# Patient Record
Sex: Female | Born: 1940 | Race: Black or African American | Hispanic: No | Smoking: Never smoker
Health system: Southern US, Community
[De-identification: ages and names within clinical notes are randomized; demographics above are authoritative.]

## PROBLEM LIST (undated history)

## (undated) DIAGNOSIS — I10 Essential (primary) hypertension: Secondary | ICD-10-CM

## (undated) DIAGNOSIS — E785 Hyperlipidemia, unspecified: Secondary | ICD-10-CM

## (undated) DIAGNOSIS — M1990 Unspecified osteoarthritis, unspecified site: Secondary | ICD-10-CM

## (undated) DIAGNOSIS — N200 Calculus of kidney: Secondary | ICD-10-CM

## (undated) DIAGNOSIS — N135 Crossing vessel and stricture of ureter without hydronephrosis: Secondary | ICD-10-CM

## (undated) DIAGNOSIS — G459 Transient cerebral ischemic attack, unspecified: Secondary | ICD-10-CM

## (undated) HISTORY — PX: ABDOMINAL HYSTERECTOMY: SHX81

## (undated) HISTORY — PX: CHOLECYSTECTOMY: SHX55

---

## 1998-02-15 ENCOUNTER — Emergency Department (HOSPITAL_COMMUNITY): Admission: EM | Admit: 1998-02-15 | Discharge: 1998-02-15 | Payer: Self-pay | Admitting: Emergency Medicine

## 1998-02-15 ENCOUNTER — Encounter: Payer: Self-pay | Admitting: Emergency Medicine

## 1998-09-03 ENCOUNTER — Emergency Department (HOSPITAL_COMMUNITY): Admission: EM | Admit: 1998-09-03 | Discharge: 1998-09-03 | Payer: Self-pay | Admitting: Emergency Medicine

## 1998-10-16 ENCOUNTER — Encounter: Payer: Self-pay | Admitting: Family Medicine

## 1998-10-16 ENCOUNTER — Ambulatory Visit (HOSPITAL_COMMUNITY): Admission: RE | Admit: 1998-10-16 | Discharge: 1998-10-16 | Payer: Self-pay | Admitting: Family Medicine

## 2004-05-02 ENCOUNTER — Other Ambulatory Visit: Payer: Self-pay

## 2004-05-02 ENCOUNTER — Emergency Department: Payer: Self-pay | Admitting: Emergency Medicine

## 2004-05-10 ENCOUNTER — Ambulatory Visit: Payer: Self-pay | Admitting: Family Medicine

## 2004-05-11 ENCOUNTER — Ambulatory Visit: Payer: Self-pay | Admitting: *Deleted

## 2004-05-24 ENCOUNTER — Ambulatory Visit: Payer: Self-pay | Admitting: Family Medicine

## 2004-05-27 ENCOUNTER — Ambulatory Visit: Payer: Self-pay | Admitting: Family Medicine

## 2004-06-07 ENCOUNTER — Ambulatory Visit (HOSPITAL_COMMUNITY): Admission: RE | Admit: 2004-06-07 | Discharge: 2004-06-07 | Payer: Self-pay | Admitting: Family Medicine

## 2004-06-29 ENCOUNTER — Encounter: Admission: RE | Admit: 2004-06-29 | Discharge: 2004-06-29 | Payer: Self-pay | Admitting: Family Medicine

## 2004-08-09 ENCOUNTER — Ambulatory Visit: Payer: Self-pay | Admitting: Family Medicine

## 2004-08-09 LAB — CONVERTED CEMR LAB: Pap Smear: NORMAL

## 2004-10-11 ENCOUNTER — Ambulatory Visit: Payer: Self-pay | Admitting: Family Medicine

## 2005-03-15 ENCOUNTER — Ambulatory Visit: Payer: Self-pay | Admitting: Family Medicine

## 2005-04-05 ENCOUNTER — Ambulatory Visit: Payer: Self-pay | Admitting: Family Medicine

## 2005-06-13 ENCOUNTER — Ambulatory Visit: Payer: Self-pay | Admitting: Family Medicine

## 2005-06-21 ENCOUNTER — Ambulatory Visit: Payer: Self-pay | Admitting: Family Medicine

## 2005-07-01 ENCOUNTER — Ambulatory Visit: Payer: Self-pay | Admitting: Family Medicine

## 2005-08-15 ENCOUNTER — Ambulatory Visit: Payer: Self-pay | Admitting: Family Medicine

## 2005-08-15 LAB — CONVERTED CEMR LAB: Hgb A1c MFr Bld: 7.5 %

## 2005-08-17 ENCOUNTER — Ambulatory Visit: Payer: Self-pay | Admitting: Family Medicine

## 2005-09-05 ENCOUNTER — Emergency Department (HOSPITAL_COMMUNITY): Admission: EM | Admit: 2005-09-05 | Discharge: 2005-09-05 | Payer: Self-pay | Admitting: Emergency Medicine

## 2005-09-12 ENCOUNTER — Ambulatory Visit: Payer: Self-pay | Admitting: Family Medicine

## 2006-01-13 ENCOUNTER — Emergency Department (HOSPITAL_COMMUNITY): Admission: EM | Admit: 2006-01-13 | Discharge: 2006-01-13 | Payer: Self-pay | Admitting: Family Medicine

## 2006-04-08 ENCOUNTER — Emergency Department (HOSPITAL_COMMUNITY): Admission: EM | Admit: 2006-04-08 | Discharge: 2006-04-08 | Payer: Self-pay | Admitting: Family Medicine

## 2006-10-05 ENCOUNTER — Encounter (INDEPENDENT_AMBULATORY_CARE_PROVIDER_SITE_OTHER): Payer: Self-pay | Admitting: Family Medicine

## 2006-10-05 DIAGNOSIS — K219 Gastro-esophageal reflux disease without esophagitis: Secondary | ICD-10-CM | POA: Insufficient documentation

## 2006-10-05 DIAGNOSIS — E119 Type 2 diabetes mellitus without complications: Secondary | ICD-10-CM | POA: Insufficient documentation

## 2006-10-05 DIAGNOSIS — I10 Essential (primary) hypertension: Secondary | ICD-10-CM | POA: Insufficient documentation

## 2006-10-05 DIAGNOSIS — F329 Major depressive disorder, single episode, unspecified: Secondary | ICD-10-CM

## 2006-10-05 DIAGNOSIS — E785 Hyperlipidemia, unspecified: Secondary | ICD-10-CM

## 2006-10-05 LAB — CONVERTED CEMR LAB
Cholesterol: 146 mg/dL
LDL Cholesterol: 94 mg/dL
Triglycerides: 60 mg/dL
VLDL: 12 mg/dL

## 2007-07-30 ENCOUNTER — Inpatient Hospital Stay (HOSPITAL_COMMUNITY): Admission: EM | Admit: 2007-07-30 | Discharge: 2007-08-03 | Payer: Self-pay | Admitting: Emergency Medicine

## 2007-07-31 ENCOUNTER — Encounter (INDEPENDENT_AMBULATORY_CARE_PROVIDER_SITE_OTHER): Payer: Self-pay | Admitting: Internal Medicine

## 2007-07-31 ENCOUNTER — Ambulatory Visit: Payer: Self-pay | Admitting: Vascular Surgery

## 2007-08-15 ENCOUNTER — Encounter (INDEPENDENT_AMBULATORY_CARE_PROVIDER_SITE_OTHER): Payer: Self-pay | Admitting: Family Medicine

## 2007-09-06 ENCOUNTER — Encounter (INDEPENDENT_AMBULATORY_CARE_PROVIDER_SITE_OTHER): Payer: Self-pay | Admitting: Family Medicine

## 2007-10-01 ENCOUNTER — Other Ambulatory Visit: Admission: RE | Admit: 2007-10-01 | Discharge: 2007-10-01 | Payer: Self-pay | Admitting: Internal Medicine

## 2007-11-12 ENCOUNTER — Encounter: Admission: RE | Admit: 2007-11-12 | Discharge: 2007-11-12 | Payer: Self-pay | Admitting: Internal Medicine

## 2008-03-12 ENCOUNTER — Encounter: Admission: RE | Admit: 2008-03-12 | Discharge: 2008-03-12 | Payer: Self-pay | Admitting: Internal Medicine

## 2008-04-21 ENCOUNTER — Encounter (INDEPENDENT_AMBULATORY_CARE_PROVIDER_SITE_OTHER): Payer: Self-pay | Admitting: Family Medicine

## 2008-06-26 ENCOUNTER — Inpatient Hospital Stay (HOSPITAL_COMMUNITY): Admission: EM | Admit: 2008-06-26 | Discharge: 2008-06-27 | Payer: Self-pay | Admitting: Emergency Medicine

## 2008-06-26 ENCOUNTER — Ambulatory Visit: Payer: Self-pay | Admitting: Family Medicine

## 2008-06-26 ENCOUNTER — Encounter: Payer: Self-pay | Admitting: Family Medicine

## 2008-06-26 DIAGNOSIS — M109 Gout, unspecified: Secondary | ICD-10-CM

## 2008-06-26 DIAGNOSIS — R1031 Right lower quadrant pain: Secondary | ICD-10-CM

## 2008-10-22 ENCOUNTER — Emergency Department (HOSPITAL_COMMUNITY): Admission: EM | Admit: 2008-10-22 | Discharge: 2008-10-22 | Payer: Self-pay | Admitting: Family Medicine

## 2009-03-14 ENCOUNTER — Emergency Department (HOSPITAL_COMMUNITY): Admission: EM | Admit: 2009-03-14 | Discharge: 2009-03-14 | Payer: Self-pay | Admitting: Emergency Medicine

## 2009-04-19 ENCOUNTER — Emergency Department: Payer: Self-pay | Admitting: Unknown Physician Specialty

## 2009-06-08 ENCOUNTER — Emergency Department (HOSPITAL_COMMUNITY): Admission: EM | Admit: 2009-06-08 | Discharge: 2009-06-08 | Payer: Self-pay | Admitting: Family Medicine

## 2009-08-26 ENCOUNTER — Encounter: Admission: RE | Admit: 2009-08-26 | Discharge: 2009-08-26 | Payer: Self-pay | Admitting: Internal Medicine

## 2010-01-27 ENCOUNTER — Encounter (INDEPENDENT_AMBULATORY_CARE_PROVIDER_SITE_OTHER): Payer: Self-pay | Admitting: *Deleted

## 2010-03-21 ENCOUNTER — Encounter: Payer: Self-pay | Admitting: Internal Medicine

## 2010-04-01 NOTE — Miscellaneous (Signed)
Summary: medical record request  Clinical Lists Changes  Rec'd medical record request to go to [facility]: 10/23/09 Melanie Ceresi  January 27, 2010 11:53 AM

## 2010-05-16 LAB — URINE MICROSCOPIC-ADD ON

## 2010-05-16 LAB — COMPREHENSIVE METABOLIC PANEL
AST: 23 U/L (ref 0–37)
Albumin: 3.4 g/dL — ABNORMAL LOW (ref 3.5–5.2)
Alkaline Phosphatase: 88 U/L (ref 39–117)
Calcium: 9 mg/dL (ref 8.4–10.5)
Chloride: 101 mEq/L (ref 96–112)
GFR calc non Af Amer: 48 mL/min — ABNORMAL LOW (ref >60)

## 2010-05-16 LAB — POCT CARDIAC MARKERS
CKMB, poc: <1 ng/mL — ABNORMAL LOW (ref 1.0–8.0)
Myoglobin, poc: 53 ng/mL (ref 12–200)
Troponin i, poc: <0.05 ng/mL (ref 0.00–0.09)

## 2010-05-16 LAB — URINALYSIS, ROUTINE W REFLEX MICROSCOPIC
Glucose, UA: 100 mg/dL — AB
Nitrite: NEGATIVE
Protein, ur: 100 mg/dL — AB

## 2010-05-16 LAB — GLUCOSE, CAPILLARY: Glucose-Capillary: 176 mg/dL — ABNORMAL HIGH (ref 70–99)

## 2010-06-05 LAB — POCT URINALYSIS DIP (DEVICE)
Bilirubin Urine: NEGATIVE
Nitrite: NEGATIVE
Urobilinogen, UA: 0.2 mg/dL (ref 0.0–1.0)
pH: 5 (ref 5.0–8.0)

## 2010-06-05 LAB — POCT I-STAT, CHEM 8
Creatinine, Ser: 1.2 mg/dL (ref 0.4–1.2)
HCT: 42 % (ref 36.0–46.0)

## 2010-06-09 LAB — CBC
HCT: 36.6 % (ref 36.0–46.0)
Hemoglobin: 11.4 g/dL — ABNORMAL LOW (ref 12.0–15.0)
MCHC: 33.3 g/dL (ref 30.0–36.0)
MCHC: 33.4 g/dL (ref 30.0–36.0)
MCV: 80.7 fL (ref 78.0–100.0)
MCV: 81.3 fL (ref 78.0–100.0)
RDW: 14.9 % (ref 11.5–15.5)
RDW: 15 % (ref 11.5–15.5)
WBC: 8.3 10*3/uL (ref 4.0–10.5)

## 2010-06-09 LAB — COMPREHENSIVE METABOLIC PANEL
ALT: 19 U/L (ref 0–35)
AST: 19 U/L (ref 0–37)
Alkaline Phosphatase: 71 U/L (ref 39–117)
Calcium: 9.6 mg/dL (ref 8.4–10.5)
Chloride: 105 mEq/L (ref 96–112)
GFR calc Af Amer: 56 mL/min — ABNORMAL LOW (ref >60)
GFR calc non Af Amer: 46 mL/min — ABNORMAL LOW (ref >60)
Glucose, Bld: 229 mg/dL — ABNORMAL HIGH (ref 70–99)
Total Bilirubin: 0.4 mg/dL (ref 0.3–1.2)

## 2010-06-09 LAB — URINALYSIS, ROUTINE W REFLEX MICROSCOPIC
Ketones, ur: 15 mg/dL — AB
Leukocytes, UA: NEGATIVE
Leukocytes, UA: NEGATIVE
Nitrite: NEGATIVE
Nitrite: NEGATIVE
Protein, ur: 30 mg/dL — AB
Specific Gravity, Urine: 1.026 (ref 1.005–1.030)
pH: 5.5 (ref 5.0–8.0)
pH: 5.5 (ref 5.0–8.0)

## 2010-06-09 LAB — BASIC METABOLIC PANEL
CO2: 28 mEq/L (ref 19–32)
Calcium: 9.3 mg/dL (ref 8.4–10.5)
Creatinine, Ser: 1.1 mg/dL (ref 0.4–1.2)
Glucose, Bld: 100 mg/dL — ABNORMAL HIGH (ref 70–99)
Sodium: 139 mEq/L (ref 135–145)

## 2010-06-09 LAB — URINE CULTURE: Colony Count: 75000

## 2010-06-09 LAB — URINE MICROSCOPIC-ADD ON

## 2010-06-09 LAB — GLUCOSE, CAPILLARY: Glucose-Capillary: 238 mg/dL — ABNORMAL HIGH (ref 70–99)

## 2010-06-09 LAB — LIPASE, BLOOD: Lipase: 36 U/L (ref 11–59)

## 2010-06-09 LAB — HEMOGLOBIN A1C: Mean Plasma Glucose: 183 mg/dL

## 2010-06-09 LAB — DIFFERENTIAL
Lymphocytes Relative: 22 % (ref 12–46)
Neutrophils Relative %: 71 % (ref 43–77)

## 2010-06-25 ENCOUNTER — Encounter (HOSPITAL_COMMUNITY): Payer: Self-pay | Admitting: Radiology

## 2010-06-25 ENCOUNTER — Emergency Department (HOSPITAL_COMMUNITY): Payer: Medicare Other

## 2010-06-25 ENCOUNTER — Inpatient Hospital Stay (HOSPITAL_COMMUNITY)
Admission: EM | Admit: 2010-06-25 | Discharge: 2010-06-26 | DRG: 313 | Disposition: A | Discharge status: Home / Self Care | Payer: Medicare Other | Source: Ambulatory Visit | Attending: Internal Medicine | Admitting: Internal Medicine

## 2010-06-25 DIAGNOSIS — R079 Chest pain, unspecified: Secondary | ICD-10-CM

## 2010-06-25 DIAGNOSIS — I10 Essential (primary) hypertension: Secondary | ICD-10-CM | POA: Diagnosis present

## 2010-06-25 DIAGNOSIS — E119 Type 2 diabetes mellitus without complications: Secondary | ICD-10-CM | POA: Diagnosis present

## 2010-06-25 DIAGNOSIS — E785 Hyperlipidemia, unspecified: Secondary | ICD-10-CM | POA: Diagnosis present

## 2010-06-25 DIAGNOSIS — K219 Gastro-esophageal reflux disease without esophagitis: Secondary | ICD-10-CM | POA: Diagnosis present

## 2010-06-25 DIAGNOSIS — I517 Cardiomegaly: Secondary | ICD-10-CM

## 2010-06-25 DIAGNOSIS — E8809 Other disorders of plasma-protein metabolism, not elsewhere classified: Secondary | ICD-10-CM | POA: Diagnosis not present

## 2010-06-25 DIAGNOSIS — M109 Gout, unspecified: Secondary | ICD-10-CM | POA: Diagnosis present

## 2010-06-25 DIAGNOSIS — R0789 Other chest pain: Principal | ICD-10-CM | POA: Diagnosis present

## 2010-06-25 DIAGNOSIS — Z79899 Other long term (current) drug therapy: Secondary | ICD-10-CM

## 2010-06-25 DIAGNOSIS — R799 Abnormal finding of blood chemistry, unspecified: Secondary | ICD-10-CM | POA: Diagnosis present

## 2010-06-25 HISTORY — DX: Essential (primary) hypertension: I10

## 2010-06-25 LAB — COMPREHENSIVE METABOLIC PANEL
Albumin: 3.5 g/dL (ref 3.5–5.2)
Alkaline Phosphatase: 71 U/L (ref 39–117)
BUN: 22 mg/dL (ref 6–23)
CO2: 27 mEq/L (ref 19–32)
Chloride: 105 mEq/L (ref 96–112)
GFR calc non Af Amer: 54 mL/min — ABNORMAL LOW (ref >60)
Glucose, Bld: 148 mg/dL — ABNORMAL HIGH (ref 70–99)
Potassium: 3.7 mEq/L (ref 3.5–5.1)
Total Bilirubin: 0.2 mg/dL — ABNORMAL LOW (ref 0.3–1.2)

## 2010-06-25 LAB — POCT I-STAT, CHEM 8
BUN: 27 mg/dL — ABNORMAL HIGH (ref 6–23)
Calcium, Ion: 1.2 mmol/L (ref 1.12–1.32)
Chloride: 107 mEq/L (ref 96–112)
Glucose, Bld: 154 mg/dL — ABNORMAL HIGH (ref 70–99)
TCO2: 28 mmol/L (ref 0–100)

## 2010-06-25 LAB — POCT CARDIAC MARKERS: Troponin i, poc: <0.05 ng/mL (ref 0.00–0.09)

## 2010-06-25 LAB — LIPID PANEL
Cholesterol: 165 mg/dL (ref 0–200)
Cholesterol: 175 mg/dL (ref 0–200)
HDL: 32 mg/dL — ABNORMAL LOW (ref >39)
HDL: 32 mg/dL — ABNORMAL LOW (ref >39)
LDL Cholesterol: 117 mg/dL — ABNORMAL HIGH (ref 0–99)
Triglycerides: 81 mg/dL (ref <150)

## 2010-06-25 LAB — CBC
HCT: 36.9 % (ref 36.0–46.0)
Hemoglobin: 12.1 g/dL (ref 12.0–15.0)
MCV: 80.6 fL (ref 78.0–100.0)
RBC: 4.58 MIL/uL (ref 3.87–5.11)
WBC: 8 10*3/uL (ref 4.0–10.5)

## 2010-06-25 LAB — CARDIAC PANEL(CRET KIN+CKTOT+MB+TROPI)
CK, MB: 1 ng/mL (ref 0.3–4.0)
Relative Index: 0.9 (ref 0.0–2.5)
Total CK: 117 U/L (ref 7–177)
Total CK: 103 U/L (ref 7–177)
Troponin I: 0.01 ng/mL (ref 0.00–0.06)
Troponin I: 0.01 ng/mL (ref 0.00–0.06)

## 2010-06-25 LAB — DIFFERENTIAL
Lymphocytes Relative: 37 % (ref 12–46)
Lymphs Abs: 3 10*3/uL (ref 0.7–4.0)
Neutrophils Relative %: 53 % (ref 43–77)

## 2010-06-25 LAB — TROPONIN I: Troponin I: 0.01 ng/mL (ref 0.00–0.06)

## 2010-06-25 LAB — CK TOTAL AND CKMB (NOT AT ARMC): Total CK: 117 U/L (ref 7–177)

## 2010-06-25 LAB — AMYLASE: Amylase: 112 U/L — ABNORMAL HIGH (ref 0–105)

## 2010-06-25 LAB — HEMOGLOBIN A1C
Hgb A1c MFr Bld: 8.2 % — ABNORMAL HIGH (ref <5.7)
Mean Plasma Glucose: 189 mg/dL — ABNORMAL HIGH (ref <117)

## 2010-06-25 LAB — GLUCOSE, CAPILLARY: Glucose-Capillary: 119 mg/dL — ABNORMAL HIGH (ref 70–99)

## 2010-06-25 MED ORDER — IOHEXOL 350 MG/ML SOLN
80.0000 mL | Freq: Once | INTRAVENOUS | Status: AC | PRN
  Administered 2010-06-25: 80 mL via INTRAVENOUS

## 2010-06-26 ENCOUNTER — Inpatient Hospital Stay (HOSPITAL_COMMUNITY): Payer: Medicare Other

## 2010-06-26 DIAGNOSIS — R079 Chest pain, unspecified: Secondary | ICD-10-CM

## 2010-06-26 LAB — CBC
MCH: 25.8 pg — ABNORMAL LOW (ref 26.0–34.0)
MCHC: 32 g/dL (ref 30.0–36.0)
Platelets: 209 10*3/uL (ref 150–400)
RBC: 4.19 MIL/uL (ref 3.87–5.11)

## 2010-06-26 LAB — COMPREHENSIVE METABOLIC PANEL
ALT: 15 U/L (ref 0–35)
AST: 15 U/L (ref 0–37)
Albumin: 3 g/dL — ABNORMAL LOW (ref 3.5–5.2)
Calcium: 8.8 mg/dL (ref 8.4–10.5)
Chloride: 106 mEq/L (ref 96–112)
Creatinine, Ser: 1.11 mg/dL (ref 0.4–1.2)
GFR calc Af Amer: 59 mL/min — ABNORMAL LOW (ref >60)
Sodium: 141 mEq/L (ref 135–145)

## 2010-06-26 LAB — GLUCOSE, CAPILLARY
Glucose-Capillary: 147 mg/dL — ABNORMAL HIGH (ref 70–99)
Glucose-Capillary: 176 mg/dL — ABNORMAL HIGH (ref 70–99)
Glucose-Capillary: 196 mg/dL — ABNORMAL HIGH (ref 70–99)

## 2010-06-26 LAB — C-REACTIVE PROTEIN: CRP: 2.5 mg/dL — ABNORMAL HIGH (ref <0.6)

## 2010-06-26 MED ORDER — TECHNETIUM TC 99M TETROFOSMIN IV KIT
30.0000 | PACK | Freq: Once | INTRAVENOUS | Status: AC | PRN
  Administered 2010-06-26: 30 via INTRAVENOUS

## 2010-06-26 MED ORDER — TECHNETIUM TC 99M TETROFOSMIN IV KIT
10.0000 | PACK | Freq: Once | INTRAVENOUS | Status: AC | PRN
  Administered 2010-06-26: 10 via INTRAVENOUS

## 2010-06-27 ENCOUNTER — Other Ambulatory Visit (HOSPITAL_COMMUNITY): Payer: Medicare Other

## 2010-06-28 NOTE — Discharge Summary (Signed)
NAMEJAMILAH, [name]:  [id number]  MEDICAL RECORD NO.:  [id number]           PATIENT TYPE:  I  LOCATION:  3735                         FACILITY:  [facility]  PHYSICIAN:  Marinda Elk, M.D.DATE OF BIRTH:  1940/07/16  DATE OF ADMISSION:  06/25/2010 DATE OF DISCHARGE:  06/26/2010                              DISCHARGE SUMMARY   PRIMARY CARE DOCTOR:  Fleet Contras, MD  DISCHARGE DIAGNOSES: 1. Atypical chest pain with negative Myoview. 2. Malignant hypertension. 3. Elevated lipase. 4. Diabetes type 2.  DISCHARGE MEDICATIONS: 1. Aspirin 81 mg daily. 2. Hydrochlorothiazide 12.5 mg daily. 3. Metformin 500 mg p.o. b.i.d. as directed. 4. Benicar 20 mg daily.  PROCEDURES PERFORMED:  Two-D echo that showed an EF of 55-60% with grade 1 diastolic heart failure.  Lexiscan that showed negative for inducible ischemia with marked pharmacological stress test.  BRIEF ADMITTING HISTORY AND PHYSICAL:  This is a 70 year old female with past medical history of diabetes type 2, hypertension, hyperlipidemia, gout, who has been experiencing sudden onset of chest pain that woke her up at 3:00 a.m. this morning.  Her left sided anterior chest wall had some nonradiating chest pain.  No nausea.  Denies any abdominal pain, vomiting or diarrhea.  Denies any shortness of breath, so she came to the ED for further evaluation.  Her blood pressure when she came to the ED was more than 200.  At the time seen by [place], it was 160.  Please refer to dictation from June 25, 2010, for further details.  Labs on admission shows an EKG, normal sinus rhythm, heart rate of 98, nonspecific T-wave changes.  Chest x-ray shows vascular congestion with borderline cardiomegaly.  Lungs clear.  White count of 8, hemoglobin of 13, platelet count of 245, sodium 141, potassium 3.8, chloride 107, bicarb 27, glucose 154, BUN of 27, creatinine 0.1.  LFTs within normal limits.  Lipase was 376.  The first set  of cardiac enzymes was negative x1.  HOSPITAL COURSE: 1. Atypical chest pain with negative Myoview.  She was admitted to the     floor, was put n.p.o.  Cardiac enzymes were cycled x3.  She did     have many risk factors for stroke.  Cardiology was consulted.  They     recommended a stress test that showed no inducible ischemia, so     Cardiology recommended to follow up with her primary care physician     and control her blood pressure. 2. Malignant hypertension.  She was started on Benicar.  This     controlled her blood pressure.  She was also started on Coreg.  She     will follow up with her primary care physician.  Her blood pressure     came down to 152/68.  She will follow up with her primary care     physician on an outpatient in 2 weeks to follow up on blood     pressure and titrate blood pressure medications as needed. 3. Elevated lipase.  The patient was put n.p.o. initially.  She did     not have any abdominal pain, just  mild epigastric tenderness with     palpation, which resolved with no treatment.  She had no radiation     of pain.  No nausea or vomiting.  She was wanting to eat.  She was     given food.  Her lipase was rechecked.  It was coming down, so this     was probably secondary to elevated blood pressure. 4. Diabetes type 2.  The patient is on no medication.  Her hemoglobin     A1c was greater than 8.  She was started on metformin, which will     be advanced.  She will follow up with her primary care physician as     an outpatient.  Vitals on the day of discharge showed temperature 97, pulse 72, respirations 18, blood pressure 152/68.  She was satting 98% on room air.  Labs on the day of discharge showed a mag of 1.8.  Her lipase was 66. Her sodium was 141, potassium 3.5, chloride 106, bicarb 27, glucose of 157, BUN of 15, creatinine 1.1.  LFTs within normal limits.  Her white count is 5.9, hemoglobin of 10.8, platelet count of 209.     Marinda Elk, M.D.     AF/MEDQ  D:  06/26/2010  T:  06/27/2010  Job:  [id number]  cc:   Fleet Contras, MD  Electronically Signed by Marinda Elk M.D. on 06/28/2010 02:33:07 PM

## 2010-07-11 NOTE — H&P (Signed)
[name], [name]:  [id number]  MEDICAL RECORD NO.:  [id number]           PATIENT TYPE:  E  LOCATION:  [facility]                         FACILITY:  [facility]  PHYSICIAN:  Eduard Clos, MDDATE OF BIRTH:  [birth year]  DATE OF ADMISSION:  06/25/2010 DATE OF DISCHARGE:                             HISTORY & PHYSICAL   PRIMARY CARE DOCTOR:  Dr. Concepcion Elk.  CHIEF COMPLAINT:  Chest pain.  HISTORY OF PRESENT ILLNESS:  A 70 year old female with known history of diabetes mellitus type 2, hypertension, hyperlipidemia, gout, who has been experiencing sudden onset of chest pain which woke her up from sleep at 3 a.m.  The pain is more on the left-sided anterior chest wall, nonradiating.  Had some nausea.  Denies any abdominal pain, any vomiting, any diarrhea.  Denies any shortness of breath.  In the ER, the patient initially was found to have high blood pressure with systolic more than 200s.  At this time, it is more improved with 160 systolic. In addition, the patient's lipase was found to be high at this time. CAT scan has been ordered.  The patient has been admitted for further workup.  The patient denies any headache, any focal deficits, any visual symptoms, dizziness, or loss of consciousness.  Denies any dysuria, discharge, or diarrhea.  PAST MEDICAL HISTORY:  Hyperlipidemia, hypertension, diabetes mellitus type 2, kidney stone, history of GERD.  Past surgical history includes cholecystectomy and hysterectomy.  MEDICATIONS ON ADMISSION:  The patient does not recall every medication. The patient is on glipizide, Diovan dose of which the patient does not recall.  The patient also states she takes a medicine for hyperlipidemia and also takes Nexium.  We need to verify all medications.  Family history is significant for diabetes mellitus type 2, hypertension.  ALLERGIES:  No known drug allergies.  SOCIAL HISTORY:  The patient lives by herself.  She states  the family stays around her.  Denies smoking cigarettes, drinking alcohol, and using illegal drugs.  REVIEW OF SYSTEMS:  As per the history of present illness, nothing else significant.  PHYSICAL EXAMINATION:  GENERAL:  The patient examined at bedside, not in acute distress. VITAL SIGNS:  Blood pressure 167/80, pulse 85 per minute, temperature 98.5, respirations 18 per minute, O2 sat is 100%. HEENT: Anicteric.  No pallor.  No discharge from ears, eyes, nose, or mouth. CHEST:  Bilateral air entry present.  No rhonchi, no crepitation. HEART:  S1, S2 heard. ABDOMEN:  Soft, nontender.  Bowel sounds heard. CNS:  The patient alert, awake, and oriented to time, place, and person. Moves upper and lower extremities 5/5. EXTREMITIES:  Peripheral pulses felt.  No edema.  LABORATORY DATA:  EKG shows normal sinus rhythm with heart rate around 98 beats per minute with nonspecific ST-T changes.  Chest x-ray shows vascular congestion and borderline cardiomegaly noted.  Lungs remain essentially clear.  CBC; WBCs 8, hemoglobin is 13.9, hematocrit is 41, platelets 245.  Complete metabolic panel; sodium 141, potassium 3.8, chloride 107, carbon dioxide 27, glucose 154, BUN 27, creatinine 1.1, total bilirubin is 0.2, alkaline phosphatase is 71, AST 18, ALT 17, total  protein 10.6, albumin 3.5, calcium 9.8, lipase 376, CK-MB less than 1, troponin less than 0.05, myoglobin is 74.1.  ASSESSMENT: 1. Chest pain.  Rule out ACS. 2. Accelerated hypertension 3. Possible pancreatitis. 4. History of hyperlipidemia. 5. History of diabetes mellitus type 2. 6. History of gout.  PLAN: 1. At this time, we will admit the patient to telemetry. 2. For chest pain, increased lipase, and accelerated hypertension, at     this time we are going to get a CT angio chest and also CT abdomen     and pelvis.  We are going to cycle cardiac markers.  The patient     will be on aspirin.  I am going to keep the patient n.p.o. at  this     time with the possibility of pancreatitis.  Probably a chest pain     could be from pancreatitis, but given her risk factor, we need to     rule out ACS.  The patient also has increased blood pressure.  At     this time, we are going to place the patient on p.r.n. labetalol     and nitroglycerin paste.  Further recommendation will be based on     cardiac enzymes with CT angio chest and abdomen and pelvis and also     we will get a 2-D echo.  Next time, we need to verify her home     medications.     Eduard Clos, MD     ANK/MEDQ  D:  06/25/2010  T:  06/25/2010  Job:  [id number]  Electronically Signed by Midge Minium MD on 07/11/2010 08:26:29 AM

## 2010-07-13 NOTE — H&P (Signed)
[name], [name]:  [id number]   MEDICAL RECORD NO.:  [id number]          PATIENT TYPE:  INP   LOCATION:  3037                         FACILITY:  [facility]   PHYSICIAN:  Hind I Elsaid, MD      DATE OF BIRTH:  02-Apr-1940   DATE OF ADMISSION:  07/30/2007  DATE OF DISCHARGE:                              HISTORY & PHYSICAL   PRIMARY CARE PHYSICIAN:  [facility].   CHIEF COMPLAINT:  Numbness of the left cheek and left hand for 1 day.   HISTORY OF PRESENT ILLNESS:  This is a 70 year old African American  female with a history of diabetes mellitus, hypertension,  hypercholesteremia with no compliance to medication.  The last time she  took her medication was more than 6 months ago.  Apparently, the patient  admitted and she did not know she has to continue her medications for  the whole period of her life and did not inform about that.  After  medications finished, she did not try to refill them.  The patient today  at 11 a.m. while she was talking on the phone developed numbness on her  left cheek and left hand.  At the beginning felt it secondary to  numbness after talking on the phone.  This numbness lasted about 10  minutes.  The patient denies any headache.  Denies any blurring of  vision.  Denies any drooping of saliva.  Denies any lower extremity or  upper extremity weakness or numbness.  Symptoms almost last 10 minutes.  The patient presented to Urgent Care where felt to have mini stroke and  asked to go to the emergency room for further evaluation.   PAST MEDICAL HISTORY:  1. Hypertension.  2. Diabetes.  3. Hypercholesteremia.  4. Gastroesophageal reflux disease.   MEDICATIONS:  None.   ALLERGIES:  No known drug allergies.   PAST SURGICAL HISTORY:  Hysterectomy and cholecystectomy.   SOCIAL HISTORY:  The patient is a widow.  Denies smoking.  Denies  alcohol abuse.  She lives by herself.  She has 4 boys and 1 daughter,  all are healthy.   FAMILY  HISTORY:  Mother died with a history of stomach cancer.  Father  died with a history of lung cancer.  She has 3 sisters, one has  endometriosis, lupus, diabetes mellitus, and hypertension.   SYSTEMIC REVIEW:  The patient denies any headache.  Denies any blurring  of vision.  Denies any seizure activity, has some nausea.  Has some  heartburn from history of gastroesophageal reflux disease.  Denies any  nausea or vomiting or abdominal pain.  Last bowel movement was today,  which was normal.  Denies any burning micturition or hematuria.   PHYSICAL EXAMINATION:  VITAL SIGNS:  Temperature 98.6, blood pressure  169/91 but reached to 200/95, pulse rate 99, respiratory rate 16, and  satting 97% on room air.  HEENT:  Normocephalic and atraumatic.  Pupils equal and reactive to  light and accommodation.  Extraocular muscle movement within normal.  Mucosa moist.  NECK:  Supple.  No JVD.  No lymphadenopathy.  No thyromegaly.  HEART:  S1 and S2 with no added sounds.  LUNGS:  Normal vesicular breathing with equal air entry.  ABDOMEN:  Soft and nontender.  Bowel sounds positive.  EXTREMITIES:  Peripheral pulses intact and there is no edema and no  peripheral cyanosis.  CNS:  The patient is alert and oriented x3.  Cranial nerves II though  XII within normal.  Power is 5/5 bilaterally and tone within normal.  Babinski sign downgoing.  Could not appreciate any neurological deficit  throughout the workup.   LABORATORY DATA:  Cardiac markers, troponin negative.  Hemoglobin 14.3  and hematocrit 42.  Sodium 140, potassium 3.3, chloride 101, glucose  259, BUN 12, and creatinine 1.4.  CT head without contrast, no  intracranial hemorrhage, mass effect, or midline shift.   ASSESSMENT:  1. Rule out transient ischemic attack versus stroke.  2. Uncontrolled diabetes mellitus.  3. Uncontrolled hypertension.  4. Hyperlipidemia.  5. Noncompliance.   PLAN:  1. To admit the patient to get MRI of the brain with  MRA and MRI, get      the carotid duplex.  2. Uncontrolled hypertension.  Placed the patient on hydralazine p.o.      and IV p.r.n.  3. Diabetes mellitus.  Placed the patient on p.o. glipizide secondary      to renal insufficiency and insulin sliding scale.  Further      recommendation to be addressed as hospital course.  We will get      hemoglobin A1c.  4. 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  We will monitor.  5. Hyperlipidemia.  Placed the patient on statin, DVT, and GI      prophylaxis.      Hind Bosie Helper, MD  Electronically Signed     HIE/MEDQ  D:  07/30/2007  T:  07/31/2007  Job:  [id number]

## 2010-07-13 NOTE — H&P (Signed)
NAMELUX, SKILTON NO.:  [id number]   MEDICAL RECORD NO.:  [id number]          PATIENT TYPE:  EMS   LOCATION:  [facility]                         FACILITY:  [facility]   PHYSICIAN:  Wayne A. Sheffield Slider, M.D.    DATE OF BIRTH:  [birth year]   DATE OF ADMISSION:  06/26/2008  DATE OF DISCHARGE:                              HISTORY & PHYSICAL   PRIMARY CARE Jeananne Bedwell:  Massie Maroon, MD, [facility]   CHIEF COMPLAINT:  Abdominal pain with nausea and vomiting.   HISTORY OF PRESENT ILLNESS:  The patient is 70 year old female with past  medical history significant for diabetes mellitus type 2 who presents  with a 1-day history of abdominal pain and nausea and vomiting.  The  patient first noticed not feeling well in the morning, she began to feel  bloated-like.  She needed to have a bowel movement, even though she had  a normal bowel movement earlier in the morning.  The bloating got worse  and then she started having a right-sided abdominal pain.  The patient  describes like a crampy pain located primarily in the right lower  quadrant worse than the right upper quadrant without any radiation.  She  subsequently developed nausea and then vomiting.  Emesis is yellow not  bilious.  The patient denies any sick contacts, diarrhea, fevers,  chills, vaginal discharge, dysuria, or chest pain.  In ED, the patient  was given Dilaudid which resolved the pain and Zofran which did not help  with nausea.  UA was positive for blood and CT scan of the abdomen was  normal.   MEDICATIONS:  1. Aspirin 81 mg 1 tablet p.o. daily.  2. Colchicine 0.6 mg 1 tablet p.o. daily.  3. Diovan 320 mg 1/2 tablet daily.  4. Diovan HCT 160/12.5 mg 1 tablet by mouth daily.  5. Nexium 40 mg 1 tablet by mouth daily.  6. Pravastatin sodium 20 mg 1 tablet by mouth daily.   ALLERGIES:  No known drug allergies.   PAST MEDICAL HISTORY:  1. Diabetes mellitus type 2.  2. GERD.  3. Depression.  4. Hyperlipidemia.  5.  Hypertension.   PAST SURGICAL HISTORY:  1. Cholecystectomy.  2. Hysterectomy.   FAMILY HISTORY:  1. Diabetes mellitus type 2.  2. Hypertension.   SOCIAL HISTORY:  The patient lives by herself in [place].  The  patient denies tobacco, alcohol, or illicit drug use.   PHYSICAL EXAMINATION:  VITAL SIGNS:  Temperature 97.3, pulse 80,  respiratory rate 20, blood pressure 193/93, rechecked 152/55, pulse ox  99% on room air.  GENERAL:  Obese.  No acute distress.  Alert and oriented x3.  EYES:  Vision grossly intact.  NOSE:  No nasal discharge.  MOUTH:  Pharynx pink and moist.  LUNGS:  Normal respiratory effort.  No crackles and no wheezes.  HEART:  Regular rhythm.  No murmur, no gallop, and tachycardia.  ABDOMEN:  Soft, no distention, no masses, no guarding, no rigidity, no  rebound or tenderness.  There is a large midline abdominal scar and the  patient has hypoactive bowel sounds.  PULSES:  Right and left dorsalis pedis pulses are normal.  EXTREMITIES:  No lower extremity edema.  SKIN:  Intact without suspicious lesions or scars.  Multiple keloids,  one on the back, one on the right ear, and one near the abdominal scar.  PSYCH:  Normally interactive.   LABORATORY DATA AND STUDIES:  1. CBC:  WBC 8.3, hemoglobin 12.2, and platelets 185.  2. BMET:  Sodium 138, potassium 4.0, chloride 105, bicarb 24, BUN 18,      creatinine 1.17, and glucose 229.  3. LFTs:  AST 19 and ALT 19.  4. Lipase 36.  5. UA:  Large hemoglobin, glucose, and protein with 15 ketones.      Negative for nitrates and leukocytes.  6. Urine micro:  0-2 wbc's, 21-50 rbc's and few bacteria.  7. CT scan of abdomen and pelvis is within normal limits.   ASSESSMENT AND PLAN:  The patient is a 70 year old female with right  lower quadrant pain and nausea and vomiting.  1. Right lower quadrant abdominal pain:  Differential diagnosis      includes kidney stones, viral gastroenteritis, appendicitis,      pancreatitis,  diverticulitis, mesenteric adenitis, and ischemic      bowel.  CT scan and laboratory studies make kidney stone the most      likely diagnosis.  We will hydrate with D5 half-normal saline at      150 mL an hour.  We will give the patient bowel rest.  We will      control pain with IV Dilaudid and control nausea with IV Phenergan.      We will hemoccult stools.  The patient may need ultrasound of      ureter and kidneys.  2. Diabetes mellitus type 2:  The patient was supposed to be on      metformin 500 mg daily but has not been taking it for the past 6      months because that makes her feel sick.  We will place the patient      on sensitive sliding-scale insulin.  3. Gastroesophageal reflux disease:  Protonix daily.  4. Hypertension:  We will continue Diovan.  5. Hyperlipidemia:  We will continue pravastatin.  6. Fluids, electrolytes, nutrition, and gastrointestinal:  D5 half-      normal saline at 150 mL an hour.  N.p.o. except      for medications.  7. Prophylactic heparin.  8. Disposition:  Pending clinical improvement.      Angelena Sole, MD  Electronically Signed      Arnette Norris. Sheffield Slider, M.D.  Electronically Signed    WS/MEDQ  D:  06/26/2008  T:  06/27/2008  Job:  [id number]

## 2010-07-13 NOTE — Discharge Summary (Signed)
[name], [name]:  [id number]   MEDICAL RECORD NO.:  [id number]          PATIENT TYPE:  INP   LOCATION:  5151                         FACILITY:  [facility]   PHYSICIAN:  Wayne A. Sheffield Slider, M.D.    DATE OF BIRTH:  Nov 02, 1940   DATE OF ADMISSION:  06/26/2008  DATE OF DISCHARGE:  06/27/2008                               DISCHARGE SUMMARY   PRIMARY CARE Ronni Osterberg:  Massie Maroon, at [facility].   DISCHARGE DIAGNOSES:  1. Kidney stone  2. Abdominal pain.  3. Diabetes mellitus type 2.  4. Gastroesophageal reflux disease.  5. Depression.  6. Hyperlipidemia.  7. Hypertension.   DISCHARGE MEDICATIONS:  1. Aspirin 81 mg p.o. daily.  2. Colchicine 0.6 mg p.o. daily.  3. Diovan 320 mg 1/2 tablet p.o. daily.  4. Diovan HCT 160/12.5 mg 1 tablet p.o. daily.  5. Nexium 40 mg p.o. daily.  6. Pravastatin 20 mg p.o. daily.  7. Vicodin 5/325 mg 1 tablet every 4 hours as needed for pain.  8. Phenergan 12.5 mg 1 tablet every 4 hours as needed for nausea.   CONSULTANTS:  None.   PROCEDURES:  1. CT scan of abdomen and pelvis on June 24, 2008:  No significant      finding in the abdomen or pelvis.  2. Renal ultrasound on June 27, 2008:  No acute finding by renal      ultrasound.  No visualized echogenic renal calculi or      hydronephrosis.   LABS:  1. CBC:  WBC 8.3, hemoglobin 12.2, hematocrit 36.6, platelets 185.  2. CMET:  Sodium 138, potassium 4.0, chloride 105, CO2 24, glucose      229, BUN 18, creatinine 1.17.  3. Lipase 36.  4. Urinalysis:  Large blood, 15 ketones, negative nitrite, negative      leukocytes.  5. Urine micro:  21-50 rbc's, few bacteria.  6. Hemoglobin A1c 8.0.   BRIEF HOSPITAL COURSE:  The patient is a 70 year old female with  diabetes mellitus type 2 who presented with right lower quadrant  abdominal pain consistent with a kidney/ureteral stone.  1. Kidney stone:  The patient presented with right lower quadrant pain  that was crampy associated with nausea and vomiting.  A urinalysis      was consistent for large amount of blood.  A CT scan and renal      ultrasound were both within normal limits.  However, given the      patient's history and laboratory findings, the patient's right      lower quadrant abdominal pain seemed most consistent with a kidney      stone.  The patient was given IV Dilaudid in the ED, which      completely resolved her pain.  The patient was also given Zofran in      the ED, which did not improve her nausea.  The patient was given      fluid bolus as well as was given IV fluid 150 mL an hour.      Overnight, the patient did extremely well.  In the  morning, the      patient was not complaining of any abdominal pain and had no      further nausea or vomiting.  The patient most likely had passed the      stone.  The patient will most likely still have some irritation      from the stone and will be discharged home with Vicodin and      Phenergan as needed.  The patient was instructed to return to the      hospital or to call her physician if she developed any worsening      abdominal pain, fevers, or new signs or symptoms.  At the day of      discharge, the patient was not complaining of any abdominal pain,      nausea, vomiting or other complaints.  The patient was tolerating a      regular diet without difficulty.  2. Diabetes mellitus type 2:  The patient was not taking her metformin      as prescribed.  The patient had not taken it for past 6 months.      The patient was placed on a sensitive sliding scale insulin, which      adequately controlled her diabetes.  The patient will need further      adjusting her medications as an outpatient.  3. Hypertension.  The patient was continued on an ARB.  The patient      was placed on olmesartan 40 mg p.o. daily, which adequately      controlled her blood pressure.   DISCHARGE INSTRUCTIONS:  The patient is to increase her activity  slowly.  The patient has no restrictions on her diet.   FOLLOWUP:  The patient is return to Dr. Selena Batten at [facility], phone number [phone].  Please call and schedule next  available appointment.   DISCHARGE CONDITION:  Stable.      Angelena Sole, MD  Electronically Signed      Arnette Norris. Sheffield Slider, M.D.  Electronically Signed    WS/MEDQ  D:  06/27/2008  T:  06/28/2008  Job:  [id number]

## 2010-07-13 NOTE — Discharge Summary (Signed)
NAMEBRIGETTE, [name]:  [id number]   MEDICAL RECORD NO.:  [id number]          PATIENT TYPE:  INP   LOCATION:  3037                         FACILITY:  [facility]   PHYSICIAN:  Michaelyn Barter, M.D. DATE OF BIRTH:  1940-07-24   DATE OF ADMISSION:  07/30/2007  DATE OF DISCHARGE:  08/03/2007                               DISCHARGE SUMMARY   FINAL DIAGNOSES:  1. Transient ischemic attack.  2. Hypertension.  3. Diabetes mellitus.  4. Hyperlipidemia.  5. Elevated TSH with a normal T4.  6. Medical noncompliance.   HISTORY OF PRESENT ILLNESS:  [name] is a 70 year old female, who  arrives with a chief complaint of numbness involving the left cheek and  left hand for approximately 1 day.  The patient indicated that she had  not taken any medications for at least 6 months despite having a history  of diabetes mellitus, hypertension, and hypercholesterolemia.  She  indicated that she ran out of her medications approximately 6 months ago  and did not realize that she needed to refill them.  On the date of  admission, while talking on the phone, she developed numbness involving  her left cheek and hand.  Her symptoms lasted for approximately 10  minutes.  She presented to the Urgent Care for evaluation.  Concerns  grew that she may have had a mini stroke and she was told to come to the  hospital for evaluation.   PAST MEDICAL HISTORY:  Please see that dictated by Dr. Ebony [name].  1. TIA.  A CT scan of the patient's head without contrast was done on      July 30, 2007, this revealed no intracranial hemorrhage, mass effect      or midline shift.  No hydrocephalus.  An MRI of the patient's brain      was also completed on July 30, 2007.  This revealed atherosclerosis.      To be suspected at the great vessel origins including the left      common carotid artery origin, stenosis of approximately 50%      stenosis with respect to the distal vessel.  No acute intracranial    abnormalities were noted in the MRI and with regards to the comment      made about the vessels that was depicted on the patient's MRA.   Over the course of hospitalization, the patient's symptoms, i.e.,  numbness or tingling improved; however, she did have what appeared to be  some numbness/tingling involving her left fifth finger and lateral arm  on August 01, 2007.  Her grip strength remained normal with good range of  motion.  However, over the past 24-48 hours the patient has not had any  repeat episodes of neurologic cold related symptoms and as of today she  appears to be back to her baseline.  Again, both CT and MRIs of the  patient's head have been completed, both of which are negative for any  acute findings.  She had a 2-D echo completed on July 31, 2007.  No  intracardiac source of emboli was apparent.  1.  Hypertension.  When the patient arrived, her blood pressure was      noted to have been elevated at 200/95.  Again, she openly admitted      that she had not taken any blood pressure medications for quite      some time.  She was started on a regimen of antihypertensive      medications and her blood pressure was better controlled by the      date of discharge.  2. Diabetes mellitus.  Again, the patient's sugars were elevated.      During her hospitalization, a regimen of diabetic medications were      initiated and the patient was provided with diabetic education from      the diabetic educator.  3. Hyperlipidemia.  Zocor was initiated during this hospital course.      A fasting lipid profile was done on July 31, 2007, it confirmed the      patient's LDL to be 129 and her cholesterol to be 171.  4. Elevated TSH.  The patient's TSH was 6.282.  A T4 was found to be      1.11, which is within the normal range.  Whether or not, the      patient has subclinical hyperthyroidism is questionable.  This can      be further followed up with her primary care physician once she is       discharged from the hospital.  On the day of discharge, the patient      had no new complaints.   PHYSICAL EXAMINATION:  VITAL SIGNS:  Her temperature was 98.2, heart  rate 90, respirations 20, blood pressure 120/61, O2 sat 94% on room air.   LABORATORY DATA:  Her last CBGs were 126, 170 and 96, respectively.  The  decision was made to discharge the patient home.   DISCHARGE HOME MEDICATIONS:  The patient will be discharged home on;  1. Norvasc 10 mg p.o. daily.  2. Ecotrin 325 mg p.o. daily.  3. Glipizide 7.5 mg p.o. b.i.d.  4. Simvastatin 40 mg daily.  5. Lisinopril 20 mg p.o. daily.  6. Metformin 500 mg p.o. b.i.d.  Should be instructed to take her medications as prescribed and to follow  up with her primary care doctor within the next 2-4 weeks.      Michaelyn Barter, M.D.  Electronically Signed     OR/MEDQ  D:  08/03/2007  T:  08/03/2007  Job:  [id number]

## 2010-07-22 NOTE — Consult Note (Signed)
NAMESEQUOIA, WITZ NO.:  [id number]  MEDICAL RECORD NO.:  [id number]           PATIENT TYPE:  I  LOCATION:  3735                         FACILITY:  [facility]  PHYSICIAN:  Pricilla Riffle, MD, FACCDATE OF BIRTH:  10/17/1940  DATE OF CONSULTATION:  06/25/2010 DATE OF DISCHARGE:                                CONSULTATION   IDENTIFICATION:  Ms. Garverick is a 70 year old that we were asked to see regarding chest pain.  HISTORY OF PRESENT ILLNESS:  The patient has no known history of coronary artery disease.  About 3 a.m. on June 23, 2010, she woke up with chest tightness.  She described as a gripping sensation associated with some nausea, no vomiting, no shortness of breath.  She came to the emergency room, there blood pressure was over 200.  She was treated, also given aspirin and nitroglycerin, pain has eased off, still present though sort of.  She denies any prior pain like this.  Not that active. Denies shortness of breath. ALLERGIES:  None.  MEDICATIONS ON ADMISSION:  The patient is unclear on Diovan, question dose aspirin, glipizide, a cholesterol medicine and a gout medicine. Here in the hospital, she is on labetalol p.r.n., Diovan 160 and aspirin 325.  PAST MEDICAL HISTORY: 1. Diabetes type 2 diagnosed 5 years ago. 2. Hypertension. 3. Dyslipidemia. 4. Gout. 5. GE reflux.  PAST SURGICAL HISTORY:  Status post cholecystectomy.  SOCIAL HISTORY:  The patient lives alone, children are nearby, does not smoke, does not drink.  FAMILY HISTORY:  Significant for hypertension and diabetes.  REVIEW OF SYSTEMS:  Denies fevers, chills, sweats.  No change in her urine.  No vomiting or diarrhea.  Does have nausea.  Does not walk much. Otherwise, all systems reviewed negative as above problem except as noted above.  PHYSICAL EXAMINATION:  GENERAL:  The patient currently in no acute distress. VITAL SIGNS:  Blood pressure 167/70, pulse 85 and regular,  temperature is 97, O2 sat on room air is 97%. HEENT:  Normocephalic, atraumatic.  EOMI.  PERRL. NECK:  JVP is normal.  No bruits.  No masses.  Thyroid appears __________. LUNGS:  Clear to auscultation.  No rales. CHEST:  Tender to palpation on the left side though does not bring on the pain she came in with. CARDIAC:  Regular rate and rhythm, S1-S2.  No S3.  No significant murmurs. ABDOMEN:  Supple, mild left-sided tenderness.  No hepatomegaly. EXTREMITIES:  2+ pulses throughout.  No lower extremity edema. NEURO:  Alert and oriented x3.  Cranial nerves II-XII grossly intact.  IMAGING:  Chest CT shows no pulmonary embolus.  Thyroid has a large complex nodule.  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  Pancreas appears normal.  Echo is pending.  Twelve-lead EKG normal sinus rhythm, 98 beats per minute, nonspecific ST-T wave changes.  LABORATORY DATA:  Significant for hemoglobin of 12, WBC of 8, platelets of 245, BUN and creatinine of 22 and 1, potassium of 3.7, hemoglobin A1c 8.2.  TSH 3.4.  Lipase is elevated significantly at 376.  CK-MB negative x2.  Troponin 0.01, 0.01, LDL of 117/132, HDL of 32, triglycerides of 57, total cholesterol 165.  IMPRESSION:  The patient is a 70 year old with diabetes, dyslipidemia, hypertension, gastroesophageal reflux, now with chest pain at rest, it is atypical but relieved, eased from nitroglycerin.  For amount of pain enzymes remain negative.  Labs are significant for a lipase of 376, her LDL is mildly elevated at 119.  EKG is negative.  RECOMMENDATIONS: 1. I am not convinced the pain is angina but she does have risk     factors.  I would recommend repeat lipase and check amylase, would     schedule Myoview to rule out ischemia if above negative, continue     aspirin. 2. Hypertension, not optimally controlled, would continue meds and     follow titrate  as needed. 3. Dyslipidemia, needs to increase statin, not clear on dosage she is     on but she needs to go up, needs better LDL control. 4. Diabetes not adequately controlled lead to primary team. 5. Health care maintenance encouraged her to walk.     Pricilla Riffle, MD, [facility]     PVR/MEDQ  D:  06/25/2010  T:  06/26/2010  Job:  [id number]  Electronically Signed by Dietrich Pates MD [facility] on 07/22/2010 12:55:33 PM

## 2010-10-12 ENCOUNTER — Emergency Department (HOSPITAL_COMMUNITY): Payer: Medicare Other

## 2010-10-12 ENCOUNTER — Emergency Department (HOSPITAL_COMMUNITY)
Admission: EM | Admit: 2010-10-12 | Discharge: 2010-10-12 | Disposition: A | Discharge status: Home / Self Care | Payer: Medicare Other | Attending: Emergency Medicine | Admitting: Emergency Medicine

## 2010-10-12 ENCOUNTER — Inpatient Hospital Stay (INDEPENDENT_AMBULATORY_CARE_PROVIDER_SITE_OTHER)
Admission: RE | Admit: 2010-10-12 | Discharge: 2010-10-12 | Disposition: A | Discharge status: Home / Self Care | Payer: Medicare Other | Source: Ambulatory Visit | Attending: Family Medicine | Admitting: Family Medicine

## 2010-10-12 ENCOUNTER — Encounter (HOSPITAL_COMMUNITY): Payer: Self-pay

## 2010-10-12 DIAGNOSIS — E119 Type 2 diabetes mellitus without complications: Secondary | ICD-10-CM | POA: Insufficient documentation

## 2010-10-12 DIAGNOSIS — R51 Headache: Secondary | ICD-10-CM | POA: Insufficient documentation

## 2010-10-12 DIAGNOSIS — R279 Unspecified lack of coordination: Secondary | ICD-10-CM | POA: Insufficient documentation

## 2010-10-12 DIAGNOSIS — Z8673 Personal history of transient ischemic attack (TIA), and cerebral infarction without residual deficits: Secondary | ICD-10-CM | POA: Insufficient documentation

## 2010-10-12 DIAGNOSIS — I10 Essential (primary) hypertension: Secondary | ICD-10-CM | POA: Insufficient documentation

## 2010-10-12 DIAGNOSIS — M6281 Muscle weakness (generalized): Secondary | ICD-10-CM

## 2010-10-12 DIAGNOSIS — R42 Dizziness and giddiness: Secondary | ICD-10-CM | POA: Insufficient documentation

## 2010-10-12 DIAGNOSIS — Z79899 Other long term (current) drug therapy: Secondary | ICD-10-CM | POA: Insufficient documentation

## 2010-10-12 DIAGNOSIS — R110 Nausea: Secondary | ICD-10-CM | POA: Insufficient documentation

## 2010-10-12 DIAGNOSIS — E785 Hyperlipidemia, unspecified: Secondary | ICD-10-CM | POA: Insufficient documentation

## 2010-10-12 HISTORY — DX: Transient cerebral ischemic attack, unspecified: G45.9

## 2010-10-12 LAB — COMPREHENSIVE METABOLIC PANEL
Albumin: 3.6 g/dL (ref 3.5–5.2)
Alkaline Phosphatase: 88 U/L (ref 39–117)
BUN: 14 mg/dL (ref 6–23)
CO2: 30 mEq/L (ref 19–32)
Chloride: 100 mEq/L (ref 96–112)
GFR calc non Af Amer: >60 mL/min (ref >60)
Glucose, Bld: 163 mg/dL — ABNORMAL HIGH (ref 70–99)
Potassium: 3.4 mEq/L — ABNORMAL LOW (ref 3.5–5.1)
Total Bilirubin: 0.3 mg/dL (ref 0.3–1.2)

## 2010-10-12 LAB — DIFFERENTIAL
Lymphs Abs: 2.1 10*3/uL (ref 0.7–4.0)
Monocytes Relative: 5 % (ref 3–12)
Neutro Abs: 4.1 10*3/uL (ref 1.7–7.7)
Neutrophils Relative %: 61 % (ref 43–77)

## 2010-10-12 LAB — CBC
HCT: 38.2 % (ref 36.0–46.0)
Hemoglobin: 12.7 g/dL (ref 12.0–15.0)
MCH: 26.3 pg (ref 26.0–34.0)
MCV: 79.3 fL (ref 78.0–100.0)
RBC: 4.82 MIL/uL (ref 3.87–5.11)

## 2010-10-12 LAB — URINALYSIS, ROUTINE W REFLEX MICROSCOPIC
Glucose, UA: NEGATIVE mg/dL
Ketones, ur: NEGATIVE mg/dL
Leukocytes, UA: NEGATIVE
Protein, ur: NEGATIVE mg/dL

## 2010-10-12 LAB — URINE MICROSCOPIC-ADD ON

## 2010-10-12 LAB — GLUCOSE, CAPILLARY: Glucose-Capillary: 179 mg/dL — ABNORMAL HIGH (ref 70–99)

## 2010-10-12 LAB — POCT I-STAT TROPONIN I: Troponin i, poc: 0 ng/mL (ref 0.00–0.08)

## 2010-11-17 ENCOUNTER — Emergency Department (HOSPITAL_COMMUNITY)
Admission: EM | Admit: 2010-11-17 | Discharge: 2010-11-17 | Disposition: A | Discharge status: Home / Self Care | Payer: Medicare Other | Attending: Emergency Medicine | Admitting: Emergency Medicine

## 2010-11-17 ENCOUNTER — Emergency Department (HOSPITAL_COMMUNITY): Payer: Medicare Other

## 2010-11-17 DIAGNOSIS — N289 Disorder of kidney and ureter, unspecified: Secondary | ICD-10-CM | POA: Insufficient documentation

## 2010-11-17 DIAGNOSIS — Z79899 Other long term (current) drug therapy: Secondary | ICD-10-CM | POA: Insufficient documentation

## 2010-11-17 DIAGNOSIS — N200 Calculus of kidney: Secondary | ICD-10-CM | POA: Insufficient documentation

## 2010-11-17 DIAGNOSIS — E785 Hyperlipidemia, unspecified: Secondary | ICD-10-CM | POA: Insufficient documentation

## 2010-11-17 DIAGNOSIS — E119 Type 2 diabetes mellitus without complications: Secondary | ICD-10-CM | POA: Insufficient documentation

## 2010-11-17 DIAGNOSIS — R109 Unspecified abdominal pain: Secondary | ICD-10-CM | POA: Insufficient documentation

## 2010-11-17 DIAGNOSIS — I10 Essential (primary) hypertension: Secondary | ICD-10-CM | POA: Insufficient documentation

## 2010-11-17 LAB — POCT I-STAT, CHEM 8
BUN: 29 mg/dL — ABNORMAL HIGH (ref 6–23)
Calcium, Ion: 1.21 mmol/L (ref 1.12–1.32)
Chloride: 103 mEq/L (ref 96–112)
Sodium: 140 mEq/L (ref 135–145)

## 2010-11-17 LAB — URINALYSIS, ROUTINE W REFLEX MICROSCOPIC
Glucose, UA: NEGATIVE mg/dL
pH: 5 (ref 5.0–8.0)

## 2010-11-17 LAB — URINE MICROSCOPIC-ADD ON

## 2010-11-25 LAB — CBC
HCT: 36.2
Hemoglobin: 11.9 — ABNORMAL LOW
MCV: 81.5
Platelets: 203
Platelets: 208
RBC: 4.51
WBC: 6.3
WBC: 6.5

## 2010-11-25 LAB — COMPREHENSIVE METABOLIC PANEL
ALT: 17
AST: 15
AST: 17
Albumin: 3.1 — ABNORMAL LOW
Albumin: 3.3 — ABNORMAL LOW
CO2: 28
Chloride: 103
Chloride: 106
Creatinine, Ser: 1.07
Creatinine, Ser: 1.22 — ABNORMAL HIGH
GFR calc Af Amer: 53 — ABNORMAL LOW
GFR calc Af Amer: >60
GFR calc non Af Amer: 44 — ABNORMAL LOW
Potassium: 3.6
Sodium: 141
Total Bilirubin: 0.4
Total Bilirubin: 0.6
Total Protein: 6.8

## 2010-11-25 LAB — URINE CULTURE: Colony Count: 35000

## 2010-11-25 LAB — URINE MICROSCOPIC-ADD ON

## 2010-11-25 LAB — URINALYSIS, ROUTINE W REFLEX MICROSCOPIC
Bilirubin Urine: NEGATIVE
Hgb urine dipstick: NEGATIVE
Nitrite: NEGATIVE
Specific Gravity, Urine: 1.009
Urobilinogen, UA: 0.2
pH: 6

## 2010-11-25 LAB — B-NATRIURETIC PEPTIDE (CONVERTED LAB): Pro B Natriuretic peptide (BNP): 59

## 2010-11-25 LAB — CK TOTAL AND CKMB (NOT AT ARMC)
CK, MB: 1.2
CK, MB: 1.5
Relative Index: 0.9
Relative Index: 1.1
Total CK: 134
Total CK: 184 — ABNORMAL HIGH

## 2010-11-25 LAB — URINE DRUGS OF ABUSE SCREEN W ALC, ROUTINE (REF LAB)
Amphetamine Screen, Ur: NEGATIVE
Barbiturate Quant, Ur: NEGATIVE
Cocaine Metabolites: NEGATIVE
Creatinine,U: 76.8
Methadone: NEGATIVE
Propoxyphene: NEGATIVE

## 2010-11-25 LAB — PROTIME-INR
INR: 1
Prothrombin Time: 13.7

## 2010-11-25 LAB — POCT CARDIAC MARKERS: Operator id: 151321

## 2010-11-25 LAB — TROPONIN I
Troponin I: 0.01
Troponin I: 0.01
Troponin I: <0.01

## 2010-11-25 LAB — LIPID PANEL
LDL Cholesterol: 129 — ABNORMAL HIGH
Triglycerides: 76

## 2010-11-25 LAB — MAGNESIUM: Magnesium: 1.8

## 2010-11-25 LAB — POCT I-STAT, CHEM 8
Calcium, Ion: 1.14
HCT: 42
TCO2: 28

## 2010-11-25 LAB — PHOSPHORUS: Phosphorus: 4.4

## 2010-11-25 LAB — HOMOCYSTEINE: Homocysteine: 10.1

## 2010-11-25 LAB — TSH: TSH: 6.282 — ABNORMAL HIGH

## 2011-01-14 ENCOUNTER — Other Ambulatory Visit: Payer: Self-pay | Admitting: Internal Medicine

## 2011-01-14 DIAGNOSIS — Z1231 Encounter for screening mammogram for malignant neoplasm of breast: Secondary | ICD-10-CM

## 2011-02-10 ENCOUNTER — Ambulatory Visit: Payer: Medicare Other

## 2011-03-18 ENCOUNTER — Other Ambulatory Visit (HOSPITAL_COMMUNITY): Payer: Self-pay | Admitting: Neurology

## 2011-03-18 DIAGNOSIS — G459 Transient cerebral ischemic attack, unspecified: Secondary | ICD-10-CM

## 2011-03-21 ENCOUNTER — Other Ambulatory Visit: Payer: Self-pay | Admitting: Neurology

## 2011-03-21 ENCOUNTER — Other Ambulatory Visit (HOSPITAL_COMMUNITY): Payer: Medicare Other | Admitting: Radiology

## 2011-03-21 DIAGNOSIS — R42 Dizziness and giddiness: Secondary | ICD-10-CM

## 2011-03-21 DIAGNOSIS — R9409 Abnormal results of other function studies of central nervous system: Secondary | ICD-10-CM

## 2011-03-21 DIAGNOSIS — H532 Diplopia: Secondary | ICD-10-CM

## 2011-03-21 DIAGNOSIS — E1100 Type 2 diabetes mellitus with hyperosmolarity without nonketotic hyperglycemic-hyperosmolar coma (NKHHC): Secondary | ICD-10-CM

## 2011-03-24 ENCOUNTER — Ambulatory Visit (HOSPITAL_COMMUNITY): Payer: Medicare Other | Attending: Cardiology | Admitting: Radiology

## 2011-03-24 DIAGNOSIS — I10 Essential (primary) hypertension: Secondary | ICD-10-CM | POA: Insufficient documentation

## 2011-03-24 DIAGNOSIS — N289 Disorder of kidney and ureter, unspecified: Secondary | ICD-10-CM | POA: Insufficient documentation

## 2011-03-24 DIAGNOSIS — E785 Hyperlipidemia, unspecified: Secondary | ICD-10-CM | POA: Insufficient documentation

## 2011-03-24 DIAGNOSIS — H532 Diplopia: Secondary | ICD-10-CM | POA: Insufficient documentation

## 2011-03-24 DIAGNOSIS — E119 Type 2 diabetes mellitus without complications: Secondary | ICD-10-CM | POA: Insufficient documentation

## 2011-03-24 DIAGNOSIS — R42 Dizziness and giddiness: Secondary | ICD-10-CM | POA: Insufficient documentation

## 2011-03-24 DIAGNOSIS — Z8673 Personal history of transient ischemic attack (TIA), and cerebral infarction without residual deficits: Secondary | ICD-10-CM | POA: Insufficient documentation

## 2011-03-24 DIAGNOSIS — E669 Obesity, unspecified: Secondary | ICD-10-CM | POA: Insufficient documentation

## 2011-03-24 DIAGNOSIS — G459 Transient cerebral ischemic attack, unspecified: Secondary | ICD-10-CM

## 2011-03-29 ENCOUNTER — Encounter (HOSPITAL_COMMUNITY): Payer: Self-pay | Admitting: Neurology

## 2011-03-30 ENCOUNTER — Ambulatory Visit
Admission: RE | Admit: 2011-03-30 | Discharge: 2011-03-30 | Disposition: A | Discharge status: Home / Self Care | Payer: Medicare Other | Source: Ambulatory Visit | Attending: Neurology | Admitting: Neurology

## 2011-03-30 DIAGNOSIS — H532 Diplopia: Secondary | ICD-10-CM

## 2011-03-30 DIAGNOSIS — E1100 Type 2 diabetes mellitus with hyperosmolarity without nonketotic hyperglycemic-hyperosmolar coma (NKHHC): Secondary | ICD-10-CM

## 2011-03-30 DIAGNOSIS — R42 Dizziness and giddiness: Secondary | ICD-10-CM

## 2011-03-30 DIAGNOSIS — R9409 Abnormal results of other function studies of central nervous system: Secondary | ICD-10-CM

## 2011-04-27 ENCOUNTER — Emergency Department (HOSPITAL_COMMUNITY)
Admission: EM | Admit: 2011-04-27 | Discharge: 2011-04-27 | Disposition: A | Discharge status: Home / Self Care | Payer: Medicare Other | Attending: Emergency Medicine | Admitting: Emergency Medicine

## 2011-04-27 ENCOUNTER — Other Ambulatory Visit: Payer: Self-pay

## 2011-04-27 ENCOUNTER — Emergency Department (HOSPITAL_COMMUNITY): Payer: Medicare Other

## 2011-04-27 ENCOUNTER — Encounter (HOSPITAL_COMMUNITY): Payer: Self-pay | Admitting: *Deleted

## 2011-04-27 DIAGNOSIS — Z7982 Long term (current) use of aspirin: Secondary | ICD-10-CM | POA: Insufficient documentation

## 2011-04-27 DIAGNOSIS — I10 Essential (primary) hypertension: Secondary | ICD-10-CM | POA: Insufficient documentation

## 2011-04-27 DIAGNOSIS — E785 Hyperlipidemia, unspecified: Secondary | ICD-10-CM | POA: Insufficient documentation

## 2011-04-27 DIAGNOSIS — R059 Cough, unspecified: Secondary | ICD-10-CM | POA: Insufficient documentation

## 2011-04-27 DIAGNOSIS — E119 Type 2 diabetes mellitus without complications: Secondary | ICD-10-CM | POA: Insufficient documentation

## 2011-04-27 DIAGNOSIS — R51 Headache: Secondary | ICD-10-CM | POA: Insufficient documentation

## 2011-04-27 DIAGNOSIS — R6883 Chills (without fever): Secondary | ICD-10-CM | POA: Insufficient documentation

## 2011-04-27 DIAGNOSIS — R0602 Shortness of breath: Secondary | ICD-10-CM | POA: Insufficient documentation

## 2011-04-27 DIAGNOSIS — R0789 Other chest pain: Secondary | ICD-10-CM | POA: Insufficient documentation

## 2011-04-27 DIAGNOSIS — R05 Cough: Secondary | ICD-10-CM | POA: Insufficient documentation

## 2011-04-27 DIAGNOSIS — Z8673 Personal history of transient ischemic attack (TIA), and cerebral infarction without residual deficits: Secondary | ICD-10-CM | POA: Insufficient documentation

## 2011-04-27 DIAGNOSIS — J3489 Other specified disorders of nose and nasal sinuses: Secondary | ICD-10-CM | POA: Insufficient documentation

## 2011-04-27 DIAGNOSIS — R0982 Postnasal drip: Secondary | ICD-10-CM | POA: Insufficient documentation

## 2011-04-27 DIAGNOSIS — R5381 Other malaise: Secondary | ICD-10-CM | POA: Insufficient documentation

## 2011-04-27 HISTORY — DX: Hyperlipidemia, unspecified: E78.5

## 2011-04-27 LAB — CBC
HCT: 37.7 % (ref 36.0–46.0)
Hemoglobin: 12.7 g/dL (ref 12.0–15.0)
RBC: 4.71 MIL/uL (ref 3.87–5.11)
RDW: 14.5 % (ref 11.5–15.5)
WBC: 6.9 10*3/uL (ref 4.0–10.5)

## 2011-04-27 LAB — POCT I-STAT TROPONIN I

## 2011-04-27 LAB — BASIC METABOLIC PANEL
BUN: 13 mg/dL (ref 6–23)
Chloride: 99 mEq/L (ref 96–112)
GFR calc Af Amer: 49 mL/min — ABNORMAL LOW (ref >90)
Glucose, Bld: 120 mg/dL — ABNORMAL HIGH (ref 70–99)
Potassium: 3.3 mEq/L — ABNORMAL LOW (ref 3.5–5.1)

## 2011-04-27 LAB — TROPONIN I: Troponin I: <0.3 ng/mL (ref <0.30)

## 2011-04-27 MED ORDER — ESOMEPRAZOLE MAGNESIUM 40 MG PO CPDR: 40.0000 mg | DELAYED_RELEASE_CAPSULE | Freq: Every day | ORAL | Status: DC

## 2011-04-27 MED ORDER — BENZONATATE 100 MG PO CAPS
100.0000 mg | ORAL_CAPSULE | Freq: Once | ORAL | Status: AC
  Administered 2011-04-27: 100 mg via ORAL
  Filled 2011-04-27: qty 1

## 2011-04-27 MED ORDER — PANTOPRAZOLE SODIUM 40 MG IV SOLR
40.0000 mg | Freq: Once | INTRAVENOUS | Status: AC
  Administered 2011-04-27: 40 mg via INTRAVENOUS
  Filled 2011-04-27: qty 40

## 2011-04-27 MED ORDER — SODIUM CHLORIDE 0.9 % IV BOLUS (SEPSIS)
1000.0000 mL | Freq: Once | INTRAVENOUS | Status: AC
  Administered 2011-04-27: 1000 mL via INTRAVENOUS

## 2011-04-27 MED ORDER — ASPIRIN 325 MG PO TABS
325.0000 mg | ORAL_TABLET | ORAL | Status: AC
  Administered 2011-04-27: 325 mg via ORAL
  Filled 2011-04-27: qty 1

## 2011-04-27 NOTE — ED Provider Notes (Signed)
Medical screening examination/treatment/procedure(s) were conducted as a shared visit with non-physician practitioner(s) and myself.  I personally evaluated the patient during the encounter  Patient with 2 weeks of mild cough and URI type symptoms.  She was treated with amoxicillin initially.  Here she has a normal chest x-ray and normal laboratory studies with no leukocytosis.  Patient appears well and on lung exam is clear lungs without signs of crackles or rhonchi.  Patient's heart rate has decreased without intervention but I will give the patient a liter of IV fluids for some rehydration as she notes some decreased by mouth intake at home due to decreased appetite.  Patient has no Swelling or leg pain and the pain in her chest not pleuritic to suggest PE.  This pain or shortness of breath is not specifically exertional to suggest acute coronary syndrome and the patient did notably have a nuclear stress test in April of 2012 which was completely normal.  Patient's BNP is normal which effectively rules out CHF.  I question whether or not patient has a longer duration viral syndrome or possible influenza that is leaving the patient with more chronic fatigue.  Patient's cough is also worse at night with lying down and I question whether or not the patient has a degree of acid reflux as she used to be on Nexium and stopped taking it approximately one year ago.  We'll reassess the patient's vital signs after the fluids and the Tessalon Perles for cough and protonic for reflux but I anticipate the patient will be able to be discharged with followup with her primary care physician next week  Nat Christen, MD 04/27/11 1546

## 2011-04-27 NOTE — ED Provider Notes (Signed)
History     CSN: [id number]  Arrival date & time 04/27/11  1311   First MD Initiated Contact with Patient 04/27/11 1335      Chief Complaint  Patient presents with  . Shortness of Breath  . Weakness  . Chest Pain    (Consider location/radiation/quality/duration/timing/severity/associated sxs/prior treatment) HPI Comments: [name], a 71 year old female with past medical history significant for HTN, hyperlipidemia, and non-insulin dependent diabetes. She presents with a 2 day history of chest tightness and shortness of breath. She has never had this kind of chest tightness before. [name] also reports a 2 week history of productive cough, congestion, frontal headaches, weakness and chills. Symptoms are worst at night. She went to see her PCP last week, who diagnosed her with a "respiratory infection," and prescribed amoxacillin. [name] took her last dose yesterday. She denies any improvement or change in symptoms from the antibiotics. She also tried mucinex, but this has not helped relieve her symptoms either. She is worried that she has developed pneumonia.  She denies chest pain, abdominal pain, leg swelling, night sweats.  The history is provided by the patient.    Past Medical History  Diagnosis Date  . Hypertension   . Diabetes mellitus   . TIA (transient ischemic attack)   . Hyperlipidemia     Past Surgical History  Procedure Date  . Cholecystectomy   . Abdominal hysterectomy     No family history on file.  History  Substance Use Topics  . Smoking status: Never Smoker   . Smokeless tobacco: Not on file  . Alcohol Use: No    OB History    Grav Para Term Preterm Abortions TAB SAB Ect Mult Living                  Review of Systems  Constitutional: Positive for chills, appetite change (reduced) and fatigue. Negative for diaphoresis.  HENT: Positive for congestion, postnasal drip and sinus pressure.   Respiratory: Positive for cough, chest tightness  and shortness of breath. Negative for wheezing and stridor.   Cardiovascular: Negative for chest pain, palpitations and leg swelling.  Gastrointestinal: Negative for nausea, vomiting, abdominal pain, diarrhea and constipation.  Neurological: Positive for weakness and headaches. Negative for syncope.   All pertinent positives and negatives reviewed in the history of present illness  Allergies  Metformin and related  Home Medications   Current Outpatient Rx  Name Route Sig Dispense Refill  . ASPIRIN EC 81 MG PO TBEC Oral Take 81 mg by mouth daily.    Marland Kitchen BENZONATATE 100 MG PO CAPS Oral Take 100-200 mg by mouth 3 (three) times daily as needed. For cough    . LATANOPROST 0.005 % OP SOLN Both Eyes Place 1 drop into both eyes at bedtime.    Marland Kitchen LISINOPRIL-HYDROCHLOROTHIAZIDE 20-25 MG PO TABS Oral Take 1 tablet by mouth daily.    Marland Kitchen POLYETHYLENE GLYCOL 3350 PO PACK Oral Take 17 g by mouth daily.    Marland Kitchen SAXAGLIPTIN HCL 5 MG PO TABS Oral Take 5 mg by mouth daily.    Marland Kitchen VITAMIN D (ERGOCALCIFEROL) 50000 UNITS PO CAPS Oral Take 50,000 Units by mouth every 7 (seven) days.      BP 115/66  Pulse 99  Temp(Src) 98.9 F (37.2 C) (Oral)  Resp 20  Ht 5' 2.5" (1.588 m)  SpO2 99%  Physical Exam  Constitutional: She is oriented to person, place, and time. She appears well-developed and well-nourished. She appears distressed.  Neck: Normal range of motion.  Cardiovascular: Normal rate, regular rhythm and normal heart sounds.  Exam reveals no gallop and no friction rub.   No murmur heard. Pulmonary/Chest: Effort normal and breath sounds normal. No respiratory distress. She has no wheezes. She has no rales. She exhibits no tenderness.  Abdominal: Soft. Bowel sounds are normal. There is no tenderness.  Neurological: She is alert and oriented to person, place, and time.  Skin: Skin is warm and dry. She is not diaphoretic. No pallor.    ED Course  Procedures (including critical care time)   Dg Chest 2  View  04/27/2011  *RADIOLOGY REPORT*  Clinical Data: Diabetes, hypertension, cough and respiratory infection.  CHEST - 2 VIEW  Comparison: 10/12/2010  Findings: No evidence of acute infiltrate or edema.  No pleural effusions.  Heart size is normal.  Stable atherosclerotic calcifications in the thoracic aorta and proliferative changes involving the spine.  IMPRESSION: The no active disease.  Original Report Authenticated By: Reola Calkins, M.D.      The patient is vastly better with fluids and protonix. She would like to go home once her testing is back. 4:46 PM Patient is looking very well in bed at this time. She is awaiting Troponin results.  MDM  MDM Reviewed: nursing note and vitals Interpretation: labs, ECG and x-ray            Carlyle Dolly, PA-C 04/27/11 1647

## 2011-04-27 NOTE — Discharge Instructions (Signed)
Start Nexium as directed. Follow up with your primary care physician and/or your prior cardiologist for further evaluation and management of recurrent mild chest pain but return to emergency department for emergent changing or worsening of symptoms.  Chest Pain (Nonspecific) It is often hard to give a specific diagnosis for the cause of chest pain. There is always a chance that your pain could be related to something serious, such as a heart attack or a blood clot in the lungs. You need to follow up with your caregiver for further evaluation. CAUSES   Heartburn.   Pneumonia or bronchitis.   Anxiety and stress.   Inflammation around your heart (pericarditis) or lung (pleuritis or pleurisy).   A blood clot in the lung.   A collapsed lung (pneumothorax). It can develop suddenly on its own (spontaneous pneumothorax) or from injury (trauma) to the chest.  The chest wall is composed of bones, muscles, and cartilage. Any of these can be the source of the pain.  The bones can be bruised by injury.   The muscles or cartilage can be strained by coughing or overwork.   The cartilage can be affected by inflammation and become sore (costochondritis).  DIAGNOSIS  Lab tests or other studies, such as X-rays, an EKG, stress testing, or cardiac imaging, may be needed to find the cause of your pain.  TREATMENT   Treatment depends on what may be causing your chest pain. Treatment may include:   Acid blockers for heartburn.   Anti-inflammatory medicine.   Pain medicine for inflammatory conditions.   Antibiotics if an infection is present.   You may be advised to change lifestyle habits. This includes stopping smoking and avoiding caffeine and chocolate.   You may be advised to keep your head raised (elevated) when sleeping. This reduces the chance of acid going backward from your stomach into your esophagus.   Most of the time, nonspecific chest pain will improve within 2 to 3 days with rest and  mild pain medicine.  HOME CARE INSTRUCTIONS   If antibiotics were prescribed, take the full amount even if you start to feel better.   For the next few days, avoid physical activities that bring on chest pain. Continue physical activities as directed.   Do not smoke cigarettes or drink alcohol until your symptoms are gone.   Only take over-the-counter or prescription medicine for pain, discomfort, or fever as directed by your caregiver.   Follow your caregiver's suggestions for further testing if your chest pain does not go away.   Keep any follow-up appointments you made. If you do not go to an appointment, you could develop lasting (chronic) problems with pain. If there is any problem keeping an appointment, you must call to reschedule.  SEEK MEDICAL CARE IF:   You think you are having problems from the medicine you are taking. Read your medicine instructions carefully.   Your chest pain does not go away, even after treatment.   You develop a rash with blisters on your chest.  SEEK IMMEDIATE MEDICAL CARE IF:   You have increased chest pain or pain that spreads to your arm, neck, jaw, back, or belly (abdomen).   You develop shortness of breath, an increasing cough, or you are coughing up blood.   You have severe back or abdominal pain, feel sick to your stomach (nauseous) or throw up (vomit).   You develop severe weakness, fainting, or chills.   You have an oral temperature above 102 F (38.9 C),  not controlled by medicine.  THIS IS AN EMERGENCY. Do not wait to see if the pain will go away. Get medical help at once. Call your local emergency services (911 in [place] Do not drive yourself to the hospital. MAKE SURE YOU:   Understand these instructions.   Will watch your condition.   Will get help right away if you are not doing well or get worse.  Document Released: 11/24/2004 Document Revised: 10/27/2010 Document Reviewed: 09/20/2007 [facility] Patient Information 2012  [place], [place].

## 2011-04-27 NOTE — ED Provider Notes (Signed)
Medical screening examination/treatment/procedure(s) were conducted as a shared visit with non-physician practitioner(s) and myself.  I personally evaluated the patient during the encounter   Nat Christen, MD 04/27/11 [phone]

## 2011-04-27 NOTE — ED Notes (Signed)
Patient reports she was told she had an uri 1 week ago. She was treated with amox but states she is still coughing and her chest is tight.  Patient is noted to be weak as well. Patient complains of sob

## 2011-04-27 NOTE — ED Provider Notes (Signed)
Patient signed out by Ebbie Ridge and Dr. Golda Acre to myself and Dr. Juleen China for pending second troponin. Patient is signed out as low risk ACS with recent stress test within the last approximate year without significant findings. Patient is denying current CP or complaint and states "I feel much better and want to go home. " Patient has follow up with both PCP and prior cardiologist as needed. Second troponin is negative   Jenness Corner, [place] 04/27/11 [phone]

## 2011-04-28 NOTE — ED Provider Notes (Signed)
Medical screening examination/treatment/procedure(s) were performed by non-physician practitioner and as supervising physician I was immediately available for consultation/collaboration.  Raeford Razor, MD 04/28/11 [phone]

## 2011-08-13 ENCOUNTER — Emergency Department (HOSPITAL_COMMUNITY): Payer: Medicare Other

## 2011-08-13 ENCOUNTER — Emergency Department (HOSPITAL_COMMUNITY)
Admission: EM | Admit: 2011-08-13 | Discharge: 2011-08-13 | Disposition: A | Discharge status: Home / Self Care | Payer: Medicare Other | Attending: Emergency Medicine | Admitting: Emergency Medicine

## 2011-08-13 ENCOUNTER — Encounter (HOSPITAL_COMMUNITY): Payer: Self-pay | Admitting: Emergency Medicine

## 2011-08-13 DIAGNOSIS — R112 Nausea with vomiting, unspecified: Secondary | ICD-10-CM

## 2011-08-13 DIAGNOSIS — E119 Type 2 diabetes mellitus without complications: Secondary | ICD-10-CM | POA: Insufficient documentation

## 2011-08-13 DIAGNOSIS — R109 Unspecified abdominal pain: Secondary | ICD-10-CM | POA: Insufficient documentation

## 2011-08-13 DIAGNOSIS — I10 Essential (primary) hypertension: Secondary | ICD-10-CM | POA: Insufficient documentation

## 2011-08-13 DIAGNOSIS — Z9089 Acquired absence of other organs: Secondary | ICD-10-CM | POA: Insufficient documentation

## 2011-08-13 DIAGNOSIS — R10819 Abdominal tenderness, unspecified site: Secondary | ICD-10-CM | POA: Insufficient documentation

## 2011-08-13 DIAGNOSIS — N23 Unspecified renal colic: Secondary | ICD-10-CM

## 2011-08-13 DIAGNOSIS — Z90710 Acquired absence of both cervix and uterus: Secondary | ICD-10-CM | POA: Insufficient documentation

## 2011-08-13 LAB — COMPREHENSIVE METABOLIC PANEL
ALT: 13 U/L (ref 0–35)
AST: 14 U/L (ref 0–37)
Albumin: 3.3 g/dL — ABNORMAL LOW (ref 3.5–5.2)
Alkaline Phosphatase: 69 U/L (ref 39–117)
BUN: 22 mg/dL (ref 6–23)
Chloride: 106 mEq/L (ref 96–112)
Potassium: 4.6 mEq/L (ref 3.5–5.1)
Sodium: 142 mEq/L (ref 135–145)
Total Bilirubin: 0.1 mg/dL — ABNORMAL LOW (ref 0.3–1.2)

## 2011-08-13 LAB — URINALYSIS, ROUTINE W REFLEX MICROSCOPIC
Bilirubin Urine: NEGATIVE
Glucose, UA: NEGATIVE mg/dL
Ketones, ur: NEGATIVE mg/dL
Leukocytes, UA: NEGATIVE
Nitrite: NEGATIVE
Protein, ur: 30 mg/dL — AB
Specific Gravity, Urine: 1.02 (ref 1.005–1.030)
Urobilinogen, UA: 0.2 mg/dL (ref 0.0–1.0)
pH: 5 (ref 5.0–8.0)

## 2011-08-13 LAB — URINE MICROSCOPIC-ADD ON

## 2011-08-13 LAB — COMPREHENSIVE METABOLIC PANEL WITH GFR
CO2: 26 meq/L (ref 19–32)
Calcium: 9.7 mg/dL (ref 8.4–10.5)
Creatinine, Ser: 1.47 mg/dL — ABNORMAL HIGH (ref 0.50–1.10)
GFR calc Af Amer: 41 mL/min — ABNORMAL LOW (ref >90)
GFR calc non Af Amer: 35 mL/min — ABNORMAL LOW (ref >90)
Glucose, Bld: 133 mg/dL — ABNORMAL HIGH (ref 70–99)
Total Protein: 7.8 g/dL (ref 6.0–8.3)

## 2011-08-13 LAB — CBC
HCT: 36.8 % (ref 36.0–46.0)
Hemoglobin: 11.8 g/dL — ABNORMAL LOW (ref 12.0–15.0)
MCH: 26.2 pg (ref 26.0–34.0)
MCHC: 32.1 g/dL (ref 30.0–36.0)
MCV: 81.6 fL (ref 78.0–100.0)
Platelets: 210 K/uL (ref 150–400)
RBC: 4.51 MIL/uL (ref 3.87–5.11)
RDW: 14.7 % (ref 11.5–15.5)
WBC: 5.3 K/uL (ref 4.0–10.5)

## 2011-08-13 LAB — LIPASE, BLOOD: Lipase: 79 U/L — ABNORMAL HIGH (ref 11–59)

## 2011-08-13 LAB — DIFFERENTIAL
Basophils Absolute: 0 10*3/uL (ref 0.0–0.1)
Basophils Relative: 1 % (ref 0–1)
Eosinophils Absolute: 0.2 K/uL (ref 0.0–0.7)
Eosinophils Relative: 4 % (ref 0–5)
Lymphocytes Relative: 37 % (ref 12–46)
Lymphs Abs: 2 K/uL (ref 0.7–4.0)
Monocytes Absolute: 0.3 K/uL (ref 0.1–1.0)
Monocytes Relative: 6 % (ref 3–12)
Neutro Abs: 2.8 10*3/uL (ref 1.7–7.7)
Neutrophils Relative %: 52 % (ref 43–77)

## 2011-08-13 MED ORDER — IOHEXOL 300 MG/ML  SOLN
100.0000 mL | Freq: Once | INTRAMUSCULAR | Status: AC | PRN
  Administered 2011-08-13: 100 mL via INTRAVENOUS

## 2011-08-13 MED ORDER — HYDROMORPHONE HCL PF 1 MG/ML IJ SOLN
1.0000 mg | Freq: Once | INTRAMUSCULAR | Status: AC
  Administered 2011-08-13: 1 mg via INTRAVENOUS
  Filled 2011-08-13: qty 1

## 2011-08-13 MED ORDER — METOCLOPRAMIDE HCL 5 MG/ML IJ SOLN
10.0000 mg | Freq: Once | INTRAMUSCULAR | Status: AC
  Administered 2011-08-13: 10 mg via INTRAVENOUS
  Filled 2011-08-13: qty 2

## 2011-08-13 MED ORDER — ONDANSETRON 4 MG PO TBDP
ORAL_TABLET | ORAL | Status: AC
  Administered 2011-08-13: 16:00:00
  Filled 2011-08-13: qty 1

## 2011-08-13 MED ORDER — HYDROCODONE-ACETAMINOPHEN 5-325 MG PO TABS: ORAL_TABLET | ORAL | Status: AC

## 2011-08-13 MED ORDER — PROMETHAZINE HCL 25 MG/ML IJ SOLN
12.5000 mg | INTRAMUSCULAR | Status: AC
  Administered 2011-08-13: 25 mg via INTRAVENOUS
  Filled 2011-08-13 (×2): qty 1

## 2011-08-13 MED ORDER — SODIUM CHLORIDE 0.9 % IV SOLN
Freq: Once | INTRAVENOUS | Status: AC
  Administered 2011-08-13: 10:00:00 via INTRAVENOUS

## 2011-08-13 MED ORDER — ONDANSETRON HCL 4 MG/2ML IJ SOLN
4.0000 mg | Freq: Once | INTRAMUSCULAR | Status: AC
  Administered 2011-08-13: 4 mg via INTRAVENOUS
  Filled 2011-08-13: qty 2

## 2011-08-13 MED ORDER — PROMETHAZINE HCL 25 MG/ML IJ SOLN: 12.5000 mg | Freq: Four times a day (QID) | INTRAMUSCULAR | Status: DC | PRN

## 2011-08-13 MED ORDER — HYDROMORPHONE HCL PF 1 MG/ML IJ SOLN
0.5000 mg | Freq: Once | INTRAMUSCULAR | Status: AC
  Administered 2011-08-13: 0.5 mg via INTRAVENOUS
  Filled 2011-08-13: qty 1

## 2011-08-13 MED ORDER — IOHEXOL 300 MG/ML  SOLN
20.0000 mL | INTRAMUSCULAR | Status: AC
  Administered 2011-08-13: 20 mL via ORAL

## 2011-08-13 MED ORDER — ONDANSETRON 8 MG PO TBDP: 8.0000 mg | ORAL_TABLET | Freq: Two times a day (BID) | ORAL | Status: AC | PRN

## 2011-08-13 NOTE — ED Notes (Signed)
Family at bedside. 

## 2011-08-13 NOTE — ED Notes (Signed)
Patient states that she woke up this morning with right sided abd pain, abd is tender to touch. Patient states it feels "like when I have a kidney stone" have had 2 kidney stones recently.

## 2011-08-13 NOTE — ED Notes (Signed)
Patient states that she is now feeling dizzy

## 2011-08-13 NOTE — ED Provider Notes (Signed)
History     CSN: [id number]  Arrival date & time 08/13/11  0910   First MD Initiated Contact with Patient 08/13/11 0913      Chief Complaint  Patient presents with  . Abdominal Pain    (Consider location/radiation/quality/duration/timing/severity/associated sxs/prior treatment) HPI Comments: Level 5 caveat due to severe pain and urgent need for intervention.  PT reports mild nausea yesterday, but this AM around 0600 was woken due to severe pain to right side.  Pt reports feels similar to kidney stone that she has had in the past.  Nausea, no vomiting.  No fever, chills.  Denies recent trauma.  Pain is diffuse to right side, doesn't radiate to groin or to back.  She did not take any meds PTA.  She has had hysterectomy in the past.  Family drove her here.    The history is provided by the patient.    Past Medical History  Diagnosis Date  . Hypertension   . Diabetes mellitus   . TIA (transient ischemic attack)   . Hyperlipidemia     Past Surgical History  Procedure Date  . Cholecystectomy   . Abdominal hysterectomy     History reviewed. No pertinent family history.  History  Substance Use Topics  . Smoking status: Never Smoker   . Smokeless tobacco: Not on file  . Alcohol Use: No    OB History    Grav Para Term Preterm Abortions TAB SAB Ect Mult Living                  Review of Systems  Unable to perform ROS: Other    Allergies  Metformin and related  Home Medications   Current Outpatient Rx  Name Route Sig Dispense Refill  . ASPIRIN EC 81 MG PO TBEC Oral Take 81 mg by mouth daily.    Marland Kitchen ESOMEPRAZOLE MAGNESIUM 40 MG PO CPDR Oral Take 40 mg by mouth daily before breakfast.    . LATANOPROST 0.005 % OP SOLN Both Eyes Place 1 drop into both eyes at bedtime.    Marland Kitchen LISINOPRIL-HYDROCHLOROTHIAZIDE 20-25 MG PO TABS Oral Take 1 tablet by mouth daily.    Marland Kitchen POLYETHYLENE GLYCOL 3350 PO PACK Oral Take 17 g by mouth daily.    Marland Kitchen SAXAGLIPTIN HCL 5 MG PO TABS Oral Take 5 mg  by mouth daily.    Marland Kitchen HYDROCODONE-ACETAMINOPHEN 5-325 MG PO TABS  1-2 tablets po q 6 hours prn moderate to severe pain 15 tablet 0  . ONDANSETRON 8 MG PO TBDP Oral Take 1 tablet (8 mg total) by mouth every 12 (twelve) hours as needed for nausea. 20 tablet 0    BP 148/67  Pulse 67  Temp 98.3 F (36.8 C) (Oral)  Resp 16  SpO2 98%  Physical Exam  Nursing note and vitals reviewed. Constitutional: She appears well-developed and well-nourished. She appears distressed.  HENT:  Head: Normocephalic and atraumatic.  Mouth/Throat: Uvula is midline and oropharynx is clear and moist.  Eyes: EOM are normal. Pupils are equal, round, and reactive to light. No scleral icterus.  Neck: Normal range of motion. Neck supple.  Cardiovascular: Normal rate.   Pulmonary/Chest: Effort normal. No respiratory distress. She has no wheezes.  Abdominal: Soft. Normal appearance. There is tenderness in the right upper quadrant, right lower quadrant, epigastric area and periumbilical area. There is guarding. There is no rebound.  Musculoskeletal: She exhibits no edema and no tenderness.  Neurological: She is alert.  Skin: Skin is warm and dry.  She is not diaphoretic.  Psychiatric: She has a normal mood and affect.    ED Course  Procedures (including critical care time)  Labs Reviewed  CBC - Abnormal; Notable for the following:    Hemoglobin 11.8 (*)     All other components within normal limits  COMPREHENSIVE METABOLIC PANEL - Abnormal; Notable for the following:    Glucose, Bld 133 (*)     Creatinine, Ser 1.47 (*)     Albumin 3.3 (*)     Total Bilirubin 0.1 (*)     GFR calc non Af Amer 35 (*)     GFR calc Af Amer 41 (*)     All other components within normal limits  LIPASE, BLOOD - Abnormal; Notable for the following:    Lipase 79 (*)     All other components within normal limits  URINALYSIS, ROUTINE W REFLEX MICROSCOPIC - Abnormal; Notable for the following:    APPearance TURBID (*)     Hgb urine  dipstick LARGE (*)     Protein, ur 30 (*)     All other components within normal limits  URINE MICROSCOPIC-ADD ON - Abnormal; Notable for the following:    Squamous Epithelial / LPF MANY (*)     Bacteria, UA MANY (*)     Crystals URIC ACID CRYSTALS (*)     All other components within normal limits  DIFFERENTIAL   Ct Abdomen Pelvis W Contrast  08/13/2011  *RADIOLOGY REPORT*  Clinical Data: Abdominal pain, status post cholecystectomy, prior renal calculus  CT ABDOMEN AND PELVIS WITH CONTRAST  Technique:  Multidetector CT imaging of the abdomen and pelvis was performed following the standard protocol during bolus administration of intravenous contrast.  Contrast: OMNIPAQUE IOHEXOL 300 MG/ML  SOLN  Comparison: 11/07/2010  Findings: Lung bases are essentially clear.  Focal fat along the falciform ligament (series 2/image 21).  Liver, spleen, pancreas, and adrenal glands are within normal limits.  Status post cholecystectomy.  No intrahepatic or extrahepatic ductal dilatation.  Left kidney is within normal limits.  Moderate right hydronephrosis, increased.  No renal calculi.  No evidence of bowel obstruction.  Normal appendix.  Atherosclerotic calcifications of the abdominal aorta and branch vessels.  No abdominopelvic ascites.  No suspicious abdominopelvic lymphadenopathy.  Status post hysterectomy.  No adnexal masses.  Mild dilatation of the proximal right ureter with surrounding periureteral stranding (series 2/image 45).  Numerous calcified pelvic phleboliths.  No definite distal ureteral calculus is seen.  Bladder is within normal limits.  Degenerative changes of the visualized thoracolumbar spine.  IMPRESSION: Moderate right hydronephrosis, increased, with dilatation of the proximal right ureter and surrounding periureteral stranding.  No definite distal ureteral calculus is seen.  These findings could reflect recent passage of a ureteral calculus or possibly a distal ureteral stricture related to  prior calculus.  Normal appendix.  No evidence of bowel obstruction.  Original Report Authenticated By: Charline Bills, M.D.   I reviewed the CT myself and reviewed radiology interpretation.    1. Ureteral colic   2. Nausea and vomiting in adult     RA sat is 100% and I interpret to be normal.  10:28 AM Pt's pain improved, made her nauseated.  More zofran ordered.    3:16 PM Pt feels improved, still sore in right side.  Did just have N/V again.  Will continue to monitor until N/V resolved, can tolerate PO's.  Will prescribe analgesics and antiemetics for home.    MDM  9:23 AM  Pt with generally tender right side abdomen, with mild guarding, no rebound.  Appears uncomfortable, but not toxic.  Will get las, UA and CT scan.  With contrast for now pending UA and Cr.  Pt has had prior surgeries in the past.  Will keep NPO, IVF's and IV antiemetics and analgesics.          Gavin Pound. Kobie Matkins, MD 08/13/11 [phone]

## 2011-08-13 NOTE — ED Notes (Signed)
MD at bedside. 

## 2011-08-13 NOTE — Discharge Instructions (Signed)
Kidney Stones Kidney stones (ureteral lithiasis) are deposits that form inside your kidneys. The intense pain is caused by the stone moving through the urinary tract. When the stone moves, the ureter goes into spasm around the stone. The stone is usually passed in the urine.  CAUSES   A disorder that makes certain neck glands produce too much parathyroid hormone (primary hyperparathyroidism).   A buildup of uric acid crystals.   Narrowing (stricture) of the ureter.   A kidney obstruction present at birth (congenital obstruction).   Previous surgery on the kidney or ureters.   Numerous kidney infections.  SYMPTOMS   Feeling sick to your stomach (nauseous).   Throwing up (vomiting).   Blood in the urine (hematuria).   Pain that usually spreads (radiates) to the groin.   Frequency or urgency of urination.  DIAGNOSIS   Taking a history and physical exam.   Blood or urine tests.   Computerized X-ray scan (CT scan).   Occasionally, an examination of the inside of the urinary bladder (cystoscopy) is performed.  TREATMENT   Observation.   Increasing your fluid intake.   Surgery may be needed if you have severe pain or persistent obstruction.  The size, location, and chemical composition are all important variables that will determine the proper choice of action for you. Talk to your caregiver to better understand your situation so that you will minimize the risk of injury to yourself and your kidney.  HOME CARE INSTRUCTIONS   Drink enough water and fluids to keep your urine clear or pale yellow.   Strain all urine through the provided strainer. Keep all particulate matter and stones for your caregiver to see. The stone causing the pain may be as small as a grain of salt. It is very important to use the strainer each and every time you pass your urine. The collection of your stone will allow your caregiver to analyze it and verify that a stone has actually passed.   Only take  over-the-counter or prescription medicines for pain, discomfort, or fever as directed by your caregiver.   Make a follow-up appointment with your caregiver as directed.   Get follow-up X-rays if required. The absence of pain does not always mean that the stone has passed. It may have only stopped moving. If the urine remains completely obstructed, it can cause loss of kidney function or even complete destruction of the kidney. It is your responsibility to make sure X-rays and follow-ups are completed. Ultrasounds of the kidney can show blockages and the status of the kidney. Ultrasounds are not associated with any radiation and can be performed easily in a matter of minutes.  SEEK IMMEDIATE MEDICAL CARE IF:   Pain cannot be controlled with the prescribed medicine.   You have a fever.   The severity or intensity of pain increases over 18 hours and is not relieved by pain medicine.   You develop a new onset of abdominal pain.   You feel faint or pass out.  MAKE SURE YOU:   Understand these instructions.   Will watch your condition.   Will get help right away if you are not doing well or get worse.  Document Released: 02/14/2005 Document Revised: 02/03/2011 Document Reviewed: 06/12/2009 [facility] Patient Information 2012 [place], [place].    Nausea and Vomiting Nausea is a sick feeling that often comes before throwing up (vomiting). Vomiting is a reflex where stomach contents come out of your mouth. Vomiting can cause severe loss of body fluids (  dehydration). Children and elderly adults can become dehydrated quickly, especially if they also have diarrhea. Nausea and vomiting are symptoms of a condition or disease. It is important to find the cause of your symptoms. CAUSES   Direct irritation of the stomach lining. This irritation can result from increased acid production (gastroesophageal reflux disease), infection, food poisoning, taking certain medicines (such as nonsteroidal  anti-inflammatory drugs), alcohol use, or tobacco use.   Signals from the brain.These signals could be caused by a headache, heat exposure, an inner ear disturbance, increased pressure in the brain from injury, infection, a tumor, or a concussion, pain, emotional stimulus, or metabolic problems.   An obstruction in the gastrointestinal tract (bowel obstruction).   Illnesses such as diabetes, hepatitis, gallbladder problems, appendicitis, kidney problems, cancer, sepsis, atypical symptoms of a heart attack, or eating disorders.   Medical treatments such as chemotherapy and radiation.   Receiving medicine that makes you sleep (general anesthetic) during surgery.  DIAGNOSIS Your caregiver may ask for tests to be done if the problems do not improve after a few days. Tests may also be done if symptoms are severe or if the reason for the nausea and vomiting is not clear. Tests may include:  Urine tests.   Blood tests.   Stool tests.   Cultures (to look for evidence of infection).   X-rays or other imaging studies.  Test results can help your caregiver make decisions about treatment or the need for additional tests. TREATMENT You need to stay well hydrated. Drink frequently but in small amounts.You may wish to drink water, sports drinks, clear broth, or eat frozen ice pops or gelatin dessert to help stay hydrated.When you eat, eating slowly may help prevent nausea.There are also some antinausea medicines that may help prevent nausea. HOME CARE INSTRUCTIONS   Take all medicine as directed by your caregiver.   If you do not have an appetite, do not force yourself to eat. However, you must continue to drink fluids.   If you have an appetite, eat a normal diet unless your caregiver tells you differently.   Eat a variety of complex carbohydrates (rice, wheat, potatoes, bread), lean meats, yogurt, fruits, and vegetables.   Avoid high-fat foods because they are more difficult to digest.    Drink enough water and fluids to keep your urine clear or pale yellow.   If you are dehydrated, ask your caregiver for specific rehydration instructions. Signs of dehydration may include:   Severe thirst.   Dry lips and mouth.   Dizziness.   Dark urine.   Decreasing urine frequency and amount.   Confusion.   Rapid breathing or pulse.  SEEK IMMEDIATE MEDICAL CARE IF:   You have blood or brown flecks (like coffee grounds) in your vomit.   You have black or bloody stools.   You have a severe headache or stiff neck.   You are confused.   You have severe abdominal pain.   You have chest pain or trouble breathing.   You do not urinate at least once every 8 hours.   You develop cold or clammy skin.   You continue to vomit for longer than 24 to 48 hours.   You have a fever.  MAKE SURE YOU:   Understand these instructions.   Will watch your condition.   Will get help right away if you are not doing well or get worse.  Document Released: 02/14/2005 Document Revised: 02/03/2011 Document Reviewed: 07/14/2010 [facility] Patient Information 2012 [place], [place].  Narcotic and benzodiazepine use may cause drowsiness, slowed breathing or dependence.  Please use with caution and do not drive, operate machinery or watch young children alone while taking them.  Taking combinations of these medications or drinking alcohol will potentiate these effects.    

## 2012-09-13 ENCOUNTER — Encounter (HOSPITAL_COMMUNITY): Payer: Self-pay | Admitting: *Deleted

## 2012-09-13 ENCOUNTER — Emergency Department (INDEPENDENT_AMBULATORY_CARE_PROVIDER_SITE_OTHER)
Admission: EM | Admit: 2012-09-13 | Discharge: 2012-09-13 | Disposition: A | Discharge status: Home / Self Care | Payer: Medicare Other | Source: Home / Self Care

## 2012-09-13 DIAGNOSIS — R42 Dizziness and giddiness: Secondary | ICD-10-CM

## 2012-09-13 DIAGNOSIS — I10 Essential (primary) hypertension: Secondary | ICD-10-CM

## 2012-09-13 DIAGNOSIS — F4321 Adjustment disorder with depressed mood: Secondary | ICD-10-CM

## 2012-09-13 DIAGNOSIS — E119 Type 2 diabetes mellitus without complications: Secondary | ICD-10-CM

## 2012-09-13 LAB — POCT I-STAT, CHEM 8
Calcium, Ion: 1.26 mmol/L (ref 1.13–1.30)
Creatinine, Ser: 1.2 mg/dL — ABNORMAL HIGH (ref 0.50–1.10)
Glucose, Bld: 141 mg/dL — ABNORMAL HIGH (ref 70–99)
HCT: 44 % (ref 36.0–46.0)
Hemoglobin: 15 g/dL (ref 12.0–15.0)

## 2012-09-13 LAB — POCT URINALYSIS DIP (DEVICE)
Ketones, ur: NEGATIVE mg/dL
Leukocytes, UA: NEGATIVE
Protein, ur: 100 mg/dL — AB
Urobilinogen, UA: 0.2 mg/dL (ref 0.0–1.0)
pH: 5 (ref 5.0–8.0)

## 2012-09-13 NOTE — ED Notes (Signed)
Pt is  Diabetic  And  Hypertensive   -     She  Reports  Non  Compliance     With  Her  meds            She  Reports  dizzyness  And         r  Side  Pain    - wike  Up  With  The  Pain  This  Am      She  Ambulates  With  Slow  Steady  Gait    She  Is  Alert and  Oriented   howver  Thought  Processes  Are  Slow

## 2012-09-13 NOTE — ED Notes (Signed)
Pt  Also  Sustained   A   Recent   Family  Loss

## 2012-09-13 NOTE — ED Provider Notes (Signed)
History    CSN: [id number] Arrival date & time 09/13/12  1344  None    Chief Complaint  Patient presents with  . Dizziness   (Consider location/radiation/quality/duration/timing/severity/associated sxs/prior Treatment) HPI Comments: 72 year old female presents with multiple complaints and symptoms. She has a history of GERD, type 2 diabetes mellitus, hypertension and TIA. Possibly week ago she began to feel like she was a fall he and with dizziness. This is associated with the headache. She also adds that she has back pains and knee pain. These are chronic complaints. She is feeling tired and "worn out." She also states she has not been taking her diabetes medication since January because she is unable to afford it. She denies any known fevers, urinary symptoms such as dysuria or frequency.  Past Medical History  Diagnosis Date  . Hypertension   . Diabetes mellitus   . TIA (transient ischemic attack)   . Hyperlipidemia    Past Surgical History  Procedure Laterality Date  . Cholecystectomy    . Abdominal hysterectomy     History reviewed. No pertinent family history. History  Substance Use Topics  . Smoking status: Never Smoker   . Smokeless tobacco: Not on file  . Alcohol Use: No   OB History   Grav Para Term Preterm Abortions TAB SAB Ect Mult Living                 Review of Systems  Constitutional: Positive for activity change, appetite change and fatigue. Negative for fever.  HENT: Negative for nosebleeds, congestion, sore throat, rhinorrhea, trouble swallowing, neck pain, neck stiffness and postnasal drip.   Respiratory: Negative.   Cardiovascular: Negative for chest pain, palpitations and leg swelling.  Gastrointestinal: Positive for nausea.  Musculoskeletal: Positive for myalgias and arthralgias.  Skin: Negative for rash.  Neurological: Positive for dizziness and headaches. Negative for tremors, seizures, syncope, facial asymmetry, speech difficulty and  numbness.  Psychiatric/Behavioral: Positive for sleep disturbance.       Grief reaction," depressed feelings, feeling under lots of stress"    Allergies  Metformin and related  Home Medications   Current Outpatient Rx  Name  Route  Sig  Dispense  Refill  . aspirin EC 81 MG tablet   Oral   Take 81 mg by mouth daily.         Marland Kitchen esomeprazole (NEXIUM) 40 MG capsule   Oral   Take 40 mg by mouth daily before breakfast.         . latanoprost (XALATAN) 0.005 % ophthalmic solution   Both Eyes   Place 1 drop into both eyes at bedtime.         Marland Kitchen lisinopril-hydrochlorothiazide (PRINZIDE,ZESTORETIC) 20-25 MG per tablet   Oral   Take 1 tablet by mouth daily.         . polyethylene glycol (MIRALAX / GLYCOLAX) packet   Oral   Take 17 g by mouth daily.         . saxagliptin HCl (ONGLYZA) 5 MG TABS tablet   Oral   Take 5 mg by mouth daily.          BP 218/96  Pulse 90  Temp(Src) 100.1 F (37.8 C)  Resp 22  SpO2 97% Physical Exam  Nursing note and vitals reviewed. Constitutional: She is oriented to person, place, and time. She appears well-developed and well-nourished. No distress.  obese  HENT:  Head: Normocephalic and atraumatic.  Right Ear: External ear normal.  Left Ear: External ear normal.  Mouth/Throat: Oropharynx is clear and moist. No oropharyngeal exudate.  Eyes: Conjunctivae and EOM are normal. Pupils are equal, round, and reactive to light.  Neck: Normal range of motion. Neck supple. No JVD present.  Cardiovascular: Normal rate, regular rhythm and normal heart sounds.   Pulmonary/Chest: Effort normal and breath sounds normal. No respiratory distress. She has no wheezes. She has no rales.  Abdominal: Soft. There is no tenderness.  Musculoskeletal: Normal range of motion. She exhibits no edema.  Lymphadenopathy:    She has no cervical adenopathy.  Neurological: She is alert and oriented to person, place, and time. No cranial nerve deficit. She exhibits  normal muscle tone.  Skin: Skin is warm and dry. No rash noted.  Psychiatric: She has a normal mood and affect.    ED Course  Procedures (including critical care time) Labs Reviewed  POCT I-STAT, CHEM 8 - Abnormal; Notable for the following:    Creatinine, Ser 1.20 (*)    Glucose, Bld 141 (*)    All other components within normal limits  POCT URINALYSIS DIP (DEVICE) - Abnormal; Notable for the following:    Protein, ur 100 (*)    All other components within normal limits   No results found. 1. T2DM (type 2 diabetes mellitus)   2. Grief reaction   3. Dizziness   4. HTN (hypertension)     MDM  The patient is in stable condition. Her electrolytes, glucose and urinalysis is within normal limits. Patient is instructed to start taking her blood pressure medicine today. I expect much of her illness is exacerbated by recent stress and grief reaction associated with the son's death. She has not had any other neurologic symptoms or focal weakness or paresthesias. SHe is alert and oriented in no acute distress. She is advised that if her temperature goes up, develops vomiting or feeling worse to the emergency department. Her significant other called her PCP while the patient was here and PCP agreed to change her diabetic medication over the phone.  Hayden Rasmussen, NP 09/13/12 1532  Hayden Rasmussen, NP 09/13/12 2113

## 2012-09-15 NOTE — ED Provider Notes (Signed)
Medical screening examination/treatment/procedure(s) were performed by resident physician or non-physician practitioner and as supervising physician I was immediately available for consultation/collaboration.   Barkley Bruns MD.   Linna Hoff, MD 09/15/12 [phone]

## 2013-01-08 ENCOUNTER — Encounter (HOSPITAL_COMMUNITY): Payer: Self-pay | Admitting: Emergency Medicine

## 2013-01-08 ENCOUNTER — Emergency Department (HOSPITAL_COMMUNITY): Payer: Medicare Other

## 2013-01-08 ENCOUNTER — Emergency Department (HOSPITAL_COMMUNITY)
Admission: EM | Admit: 2013-01-08 | Discharge: 2013-01-08 | Disposition: A | Discharge status: Home / Self Care | Payer: Medicare Other | Attending: Emergency Medicine | Admitting: Emergency Medicine

## 2013-01-08 DIAGNOSIS — R110 Nausea: Secondary | ICD-10-CM | POA: Insufficient documentation

## 2013-01-08 DIAGNOSIS — Z7982 Long term (current) use of aspirin: Secondary | ICD-10-CM | POA: Insufficient documentation

## 2013-01-08 DIAGNOSIS — E785 Hyperlipidemia, unspecified: Secondary | ICD-10-CM | POA: Insufficient documentation

## 2013-01-08 DIAGNOSIS — Z888 Allergy status to other drugs, medicaments and biological substances status: Secondary | ICD-10-CM | POA: Insufficient documentation

## 2013-01-08 DIAGNOSIS — M25569 Pain in unspecified knee: Secondary | ICD-10-CM | POA: Insufficient documentation

## 2013-01-08 DIAGNOSIS — R079 Chest pain, unspecified: Secondary | ICD-10-CM

## 2013-01-08 DIAGNOSIS — M129 Arthropathy, unspecified: Secondary | ICD-10-CM | POA: Insufficient documentation

## 2013-01-08 DIAGNOSIS — Z8673 Personal history of transient ischemic attack (TIA), and cerebral infarction without residual deficits: Secondary | ICD-10-CM | POA: Insufficient documentation

## 2013-01-08 DIAGNOSIS — E119 Type 2 diabetes mellitus without complications: Secondary | ICD-10-CM | POA: Insufficient documentation

## 2013-01-08 DIAGNOSIS — Z79899 Other long term (current) drug therapy: Secondary | ICD-10-CM | POA: Insufficient documentation

## 2013-01-08 DIAGNOSIS — R61 Generalized hyperhidrosis: Secondary | ICD-10-CM | POA: Insufficient documentation

## 2013-01-08 DIAGNOSIS — I10 Essential (primary) hypertension: Secondary | ICD-10-CM | POA: Insufficient documentation

## 2013-01-08 DIAGNOSIS — R0789 Other chest pain: Secondary | ICD-10-CM | POA: Insufficient documentation

## 2013-01-08 LAB — CBC WITH DIFFERENTIAL/PLATELET
Basophils Absolute: 0 10*3/uL (ref 0.0–0.1)
Basophils Relative: 1 % (ref 0–1)
Eosinophils Absolute: 0.2 10*3/uL (ref 0.0–0.7)
Eosinophils Relative: 4 % (ref 0–5)
HCT: 38.6 % (ref 36.0–46.0)
Hemoglobin: 13.1 g/dL (ref 12.0–15.0)
Lymphocytes Relative: 34 % (ref 12–46)
Lymphs Abs: 2.2 10*3/uL (ref 0.7–4.0)
MCH: 27.6 pg (ref 26.0–34.0)
MCHC: 33.9 g/dL (ref 30.0–36.0)
MCV: 81.3 fL (ref 78.0–100.0)
Monocytes Absolute: 0.4 10*3/uL (ref 0.1–1.0)
Monocytes Relative: 6 % (ref 3–12)
Neutro Abs: 3.6 10*3/uL (ref 1.7–7.7)
Neutrophils Relative %: 56 % (ref 43–77)
Platelets: 221 10*3/uL (ref 150–400)
RBC: 4.75 MIL/uL (ref 3.87–5.11)
RDW: 14.2 % (ref 11.5–15.5)
WBC: 6.5 10*3/uL (ref 4.0–10.5)

## 2013-01-08 LAB — COMPREHENSIVE METABOLIC PANEL
ALT: 13 U/L (ref 0–35)
AST: 14 U/L (ref 0–37)
Albumin: 3.6 g/dL (ref 3.5–5.2)
Alkaline Phosphatase: 77 U/L (ref 39–117)
BUN: 21 mg/dL (ref 6–23)
CO2: 27 mEq/L (ref 19–32)
Calcium: 10.1 mg/dL (ref 8.4–10.5)
Chloride: 101 mEq/L (ref 96–112)
Creatinine, Ser: 1.33 mg/dL — ABNORMAL HIGH (ref 0.50–1.10)
GFR calc Af Amer: 45 mL/min — ABNORMAL LOW (ref >90)
GFR calc non Af Amer: 39 mL/min — ABNORMAL LOW (ref >90)
Glucose, Bld: 144 mg/dL — ABNORMAL HIGH (ref 70–99)
Potassium: 3.8 mEq/L (ref 3.5–5.1)
Sodium: 137 mEq/L (ref 135–145)
Total Bilirubin: 0.1 mg/dL — ABNORMAL LOW (ref 0.3–1.2)
Total Protein: 8.3 g/dL (ref 6.0–8.3)

## 2013-01-08 LAB — POCT I-STAT TROPONIN I: Troponin i, poc: 0.01 ng/mL (ref 0.00–0.08)

## 2013-01-08 LAB — GLUCOSE, CAPILLARY: Glucose-Capillary: 143 mg/dL — ABNORMAL HIGH (ref 70–99)

## 2013-01-08 MED ORDER — TRAMADOL HCL 50 MG PO TABS: 50.0000 mg | ORAL_TABLET | Freq: Four times a day (QID) | ORAL | Status: DC | PRN

## 2013-01-08 MED ORDER — IBUPROFEN 400 MG PO TABS
400.0000 mg | ORAL_TABLET | Freq: Once | ORAL | Status: AC
  Administered 2013-01-08: 400 mg via ORAL
  Filled 2013-01-08: qty 1

## 2013-01-08 MED ORDER — OXYCODONE-ACETAMINOPHEN 5-325 MG PO TABS
2.0000 | ORAL_TABLET | Freq: Once | ORAL | Status: AC
  Administered 2013-01-08: 2 via ORAL
  Filled 2013-01-08: qty 2

## 2013-01-08 MED ORDER — ESOMEPRAZOLE MAGNESIUM 40 MG PO CPDR: 40.0000 mg | DELAYED_RELEASE_CAPSULE | Freq: Every day | ORAL | Status: DC

## 2013-01-08 NOTE — ED Notes (Addendum)
Pt is here with bilateral knee pain and pulses present.  Pt reports not checked sugar and has been having heaviness in her chest at nite

## 2013-01-08 NOTE — ED Notes (Addendum)
Pt verbalizes bilateral knee x 1 week that worsens with movement. Pt also verbalizes chest pressure at night that is non- radiating x 4 days. Pt denies injury.

## 2013-01-08 NOTE — ED Notes (Signed)
MD at bedside. 

## 2013-01-08 NOTE — ED Notes (Signed)
Verbal order given by sabrina to do cbg on patient in triage.

## 2013-01-08 NOTE — ED Notes (Signed)
Patient transported to X-ray 

## 2013-01-11 ENCOUNTER — Encounter (HOSPITAL_COMMUNITY): Payer: Self-pay | Admitting: Emergency Medicine

## 2013-01-11 ENCOUNTER — Emergency Department (HOSPITAL_COMMUNITY)
Admission: EM | Admit: 2013-01-11 | Discharge: 2013-01-11 | Disposition: A | Discharge status: Home / Self Care | Payer: Medicare Other | Attending: Emergency Medicine | Admitting: Emergency Medicine

## 2013-01-11 ENCOUNTER — Emergency Department (HOSPITAL_COMMUNITY): Payer: Medicare Other

## 2013-01-11 DIAGNOSIS — I10 Essential (primary) hypertension: Secondary | ICD-10-CM | POA: Insufficient documentation

## 2013-01-11 DIAGNOSIS — Z7982 Long term (current) use of aspirin: Secondary | ICD-10-CM | POA: Insufficient documentation

## 2013-01-11 DIAGNOSIS — Z8673 Personal history of transient ischemic attack (TIA), and cerebral infarction without residual deficits: Secondary | ICD-10-CM | POA: Insufficient documentation

## 2013-01-11 DIAGNOSIS — M1710 Unilateral primary osteoarthritis, unspecified knee: Secondary | ICD-10-CM | POA: Insufficient documentation

## 2013-01-11 DIAGNOSIS — E119 Type 2 diabetes mellitus without complications: Secondary | ICD-10-CM | POA: Insufficient documentation

## 2013-01-11 DIAGNOSIS — M25569 Pain in unspecified knee: Secondary | ICD-10-CM | POA: Insufficient documentation

## 2013-01-11 DIAGNOSIS — Z888 Allergy status to other drugs, medicaments and biological substances status: Secondary | ICD-10-CM | POA: Insufficient documentation

## 2013-01-11 DIAGNOSIS — M25561 Pain in right knee: Secondary | ICD-10-CM

## 2013-01-11 DIAGNOSIS — M129 Arthropathy, unspecified: Secondary | ICD-10-CM | POA: Insufficient documentation

## 2013-01-11 DIAGNOSIS — R42 Dizziness and giddiness: Secondary | ICD-10-CM | POA: Insufficient documentation

## 2013-01-11 DIAGNOSIS — M1990 Unspecified osteoarthritis, unspecified site: Secondary | ICD-10-CM

## 2013-01-11 DIAGNOSIS — M6281 Muscle weakness (generalized): Secondary | ICD-10-CM | POA: Insufficient documentation

## 2013-01-11 DIAGNOSIS — E669 Obesity, unspecified: Secondary | ICD-10-CM | POA: Insufficient documentation

## 2013-01-11 DIAGNOSIS — R2689 Other abnormalities of gait and mobility: Secondary | ICD-10-CM

## 2013-01-11 DIAGNOSIS — IMO0002 Reserved for concepts with insufficient information to code with codable children: Secondary | ICD-10-CM | POA: Insufficient documentation

## 2013-01-11 DIAGNOSIS — E785 Hyperlipidemia, unspecified: Secondary | ICD-10-CM | POA: Insufficient documentation

## 2013-01-11 DIAGNOSIS — Z79899 Other long term (current) drug therapy: Secondary | ICD-10-CM | POA: Insufficient documentation

## 2013-01-11 HISTORY — DX: Unspecified osteoarthritis, unspecified site: M19.90

## 2013-01-11 LAB — CBC WITH DIFFERENTIAL/PLATELET
Basophils Absolute: 0 10*3/uL (ref 0.0–0.1)
Basophils Relative: 0 % (ref 0–1)
Eosinophils Absolute: 0 10*3/uL (ref 0.0–0.7)
Eosinophils Relative: 0 % (ref 0–5)
MCH: 27.6 pg (ref 26.0–34.0)
MCHC: 34.4 g/dL (ref 30.0–36.0)
MCV: 80.1 fL (ref 78.0–100.0)
Monocytes Relative: 4 % (ref 3–12)
Neutrophils Relative %: 87 % — ABNORMAL HIGH (ref 43–77)
Platelets: 240 10*3/uL (ref 150–400)
RBC: 4.93 MIL/uL (ref 3.87–5.11)
RDW: 14.3 % (ref 11.5–15.5)

## 2013-01-11 LAB — URINALYSIS, ROUTINE W REFLEX MICROSCOPIC
Hgb urine dipstick: NEGATIVE
Nitrite: NEGATIVE
Protein, ur: 100 mg/dL — AB
Specific Gravity, Urine: 1.02 (ref 1.005–1.030)
Urobilinogen, UA: 0.2 mg/dL (ref 0.0–1.0)

## 2013-01-11 LAB — BASIC METABOLIC PANEL
BUN: 24 mg/dL — ABNORMAL HIGH (ref 6–23)
Calcium: 10.1 mg/dL (ref 8.4–10.5)
Chloride: 95 mEq/L — ABNORMAL LOW (ref 96–112)
GFR calc Af Amer: 45 mL/min — ABNORMAL LOW (ref >90)
GFR calc non Af Amer: 38 mL/min — ABNORMAL LOW (ref >90)
Glucose, Bld: 198 mg/dL — ABNORMAL HIGH (ref 70–99)
Potassium: 3.6 mEq/L (ref 3.5–5.1)
Sodium: 135 mEq/L (ref 135–145)

## 2013-01-11 LAB — URINE MICROSCOPIC-ADD ON

## 2013-01-11 MED ORDER — ONDANSETRON 4 MG PO TBDP
4.0000 mg | ORAL_TABLET | Freq: Once | ORAL | Status: AC
  Administered 2013-01-11: 4 mg via ORAL
  Filled 2013-01-11: qty 1

## 2013-01-11 MED ORDER — DIAZEPAM 5 MG PO TABS: 5.0000 mg | ORAL_TABLET | Freq: Two times a day (BID) | ORAL | Status: DC | PRN

## 2013-01-11 MED ORDER — SODIUM CHLORIDE 0.9 % IV BOLUS (SEPSIS)
1000.0000 mL | Freq: Once | INTRAVENOUS | Status: AC
  Administered 2013-01-11: 1000 mL via INTRAVENOUS

## 2013-01-11 MED ORDER — ONDANSETRON HCL 4 MG PO TABS: 4.0000 mg | ORAL_TABLET | Freq: Three times a day (TID) | ORAL | Status: DC | PRN

## 2013-01-11 MED ORDER — ONDANSETRON HCL 4 MG/2ML IJ SOLN
4.0000 mg | Freq: Once | INTRAMUSCULAR | Status: AC
  Administered 2013-01-11: 4 mg via INTRAVENOUS
  Filled 2013-01-11: qty 2

## 2013-01-11 MED ORDER — DIAZEPAM 5 MG PO TABS
5.0000 mg | ORAL_TABLET | Freq: Once | ORAL | Status: AC
  Administered 2013-01-11: 5 mg via ORAL
  Filled 2013-01-11: qty 1

## 2013-01-11 MED ORDER — HYDROMORPHONE HCL PF 1 MG/ML IJ SOLN
1.0000 mg | Freq: Once | INTRAMUSCULAR | Status: AC
  Administered 2013-01-11: 1 mg via INTRAMUSCULAR
  Filled 2013-01-11: qty 1

## 2013-01-11 MED ORDER — OXYCODONE-ACETAMINOPHEN 5-325 MG PO TABS: 1.0000 | ORAL_TABLET | Freq: Four times a day (QID) | ORAL | Status: DC | PRN

## 2013-01-11 NOTE — ED Provider Notes (Addendum)
CSN: [id number]     Arrival date & time 01/11/13  1126 History   First MD Initiated Contact with Patient 01/11/13 1204     Chief Complaint  Patient presents with  . Nausea  . Weakness   (Consider location/radiation/quality/duration/timing/severity/associated sxs/prior Treatment) Patient is a 72 y.o. female presenting with knee pain. The history is provided by the patient.  Knee Pain Location:  Knee Time since incident:  2 days Injury: no   Knee location:  R knee Pain details:    Quality:  Aching, cramping, sharp, pressure, shooting and throbbing   Radiates to:  R leg   Severity:  Moderate   Onset quality:  Gradual   Duration:  2 days   Timing:  Constant   Progression:  Worsening Chronicity:  New Dislocation: no   Relieved by:  Nothing Worsened by:  Activity, bearing weight and flexion Ineffective treatments: ultram. Associated symptoms: decreased ROM, stiffness and swelling   Associated symptoms: no back pain, no fever and no muscle weakness   Risk factors: obesity   Risk factors: no concern for non-accidental trauma and no known bone disorder     Past Medical History  Diagnosis Date  . Hypertension   . Diabetes mellitus   . TIA (transient ischemic attack)   . Hyperlipidemia   . Arthritis    Past Surgical History  Procedure Laterality Date  . Cholecystectomy    . Abdominal hysterectomy     History reviewed. No pertinent family history. History  Substance Use Topics  . Smoking status: Never Smoker   . Smokeless tobacco: Not on file  . Alcohol Use: No   OB History   Grav Para Term Preterm Abortions TAB SAB Ect Mult Living                 Review of Systems  Constitutional: Negative for fever.  Respiratory: Negative for shortness of breath.   Cardiovascular: Negative for chest pain.  Gastrointestinal: Positive for nausea.  Musculoskeletal: Positive for stiffness. Negative for back pain.  Neurological: Positive for dizziness and light-headedness.        Intermittent nausea and dizziness that she has had for some time.  All other systems reviewed and are negative.    Allergies  Metformin and related  Home Medications   Current Outpatient Rx  Name  Route  Sig  Dispense  Refill  . aspirin EC 81 MG tablet   Oral   Take 81 mg by mouth daily.         . colchicine 0.6 MG tablet   Oral   Take 0.6 mg by mouth daily as needed (for gout).         Marland Kitchen esomeprazole (NEXIUM) 40 MG capsule   Oral   Take 1 capsule (40 mg total) by mouth daily.   30 capsule   0   . glimepiride (AMARYL) 4 MG tablet   Oral   Take 4 mg by mouth daily with breakfast.         . lisinopril-hydrochlorothiazide (PRINZIDE,ZESTORETIC) 20-25 MG per tablet   Oral   Take 1 tablet by mouth daily.         . traMADol (ULTRAM) 50 MG tablet   Oral   Take 1 tablet (50 mg total) by mouth every 6 (six) hours as needed.   15 tablet   0    BP 139/35  Pulse 103  Temp(Src) 98.4 F (36.9 C) (Oral)  Resp 17  Wt 193 lb (87.544 kg)  SpO2  95% Physical Exam  Nursing note and vitals reviewed. Constitutional: She is oriented to person, place, and time. She appears well-developed and well-nourished. No distress.  HENT:  Head: Normocephalic and atraumatic.  Mouth/Throat: Oropharynx is clear and moist.  Eyes: Conjunctivae and EOM are normal. Pupils are equal, round, and reactive to light.  Neck: Normal range of motion. Neck supple.  Cardiovascular: Normal rate, regular rhythm and intact distal pulses.   No murmur heard. Pulmonary/Chest: Effort normal and breath sounds normal. No respiratory distress. She has no wheezes. She has no rales.  Abdominal: Soft. She exhibits no distension. There is no tenderness. There is no rebound and no guarding.  Musculoskeletal: She exhibits tenderness. She exhibits no edema.       Right knee: She exhibits decreased range of motion and swelling. She exhibits no ecchymosis, no deformity and no erythema. Tenderness found. Medial joint  line and MCL tenderness noted.  Neurological: She is alert and oriented to person, place, and time.  Skin: Skin is warm and dry. No rash noted. No erythema.  Psychiatric: She has a normal mood and affect. Her behavior is normal.    ED Course  Procedures (including critical care time) Labs Review Labs Reviewed  CBC WITH DIFFERENTIAL - Abnormal; Notable for the following:    WBC 10.6 (*)    Neutrophils Relative % 87 (*)    Neutro Abs 9.1 (*)    Lymphocytes Relative 9 (*)    All other components within normal limits  BASIC METABOLIC PANEL - Abnormal; Notable for the following:    Chloride 95 (*)    Glucose, Bld 198 (*)    BUN 24 (*)    Creatinine, Ser 1.35 (*)    GFR calc non Af Amer 38 (*)    GFR calc Af Amer 45 (*)    All other components within normal limits  URINALYSIS, ROUTINE W REFLEX MICROSCOPIC - Abnormal; Notable for the following:    APPearance CLOUDY (*)    Bilirubin Urine SMALL (*)    Ketones, ur 15 (*)    Protein, ur 100 (*)    All other components within normal limits  URINE MICROSCOPIC-ADD ON - Abnormal; Notable for the following:    Squamous Epithelial / LPF FEW (*)    Bacteria, UA FEW (*)    Casts HYALINE CASTS (*)    All other components within normal limits   Imaging Review Dg Knee Complete 4 Views Right  01/11/2013   CLINICAL DATA:  Pain.  EXAM: RIGHT KNEE - COMPLETE 4+ VIEW  COMPARISON:  None.  FINDINGS: Patellofemoral femoral tibial degenerative change present. There is potentially prominent loss of joint space along the medial compartment. Vascular calcification consistent with atherosclerotic vascular disease present.  IMPRESSION: Prominent degenerative change particularly the medial compartment.   Electronically Signed   By: Maisie Fus  Register   On: 01/11/2013 14:37    EKG Interpretation   None       MDM   1. Right knee pain   2. Osteoarthritis   3. Decreased mobility     Patient presenting initially with right knee pain that she says has  worsened to the point last night that she cannot get out of bed because the pain was so severe. She has been taking Ultram at home without improvement. Patient denies any infectious symptoms such as fever, swelling or redness of the knee. She does have mild swelling and tenderness with palpation on the medial portion of the leg.  She continually asked  to be admitted because she lives alone in the pain is so severe however do not feel that she has the attempted good pain control at home. Discussed with her that we will attempt pain control here and check some lab tests. The patient was told that she will most likely not be admitted she started stating she felt dizzy, nauseated and weak.  Pt had full lab eval and ekg done 3 days ago with troponin which were all neg.  CBC, BMP, UA, knee films pending. Low suspicion for septic joint at this time. Patient given pain control  3:09 PM Labs are reassuring and x-ray shows medial compartment knee degenerative changes which is exactly where the patient's pain is. Her pain is improved after pain medication however she does feel groggy from meds so will allow her 2 rest here for a longer period.  Will also speak with case management for home care.  4:55 PM Home health ordered for pt but on return to room pt vomiting and feeling lightheaded and nauseated.  Most likely related to meds.  Will repeat VS and given zofran and IVF.  7:04 PM Nausea improved and pain is improved.  Pt will be c/ed home with home health to come on Monday.  When up to the wheelchair pt had another episode of vomiting.   Also supplied with bedside commode.  Gwyneth Sprout, MD 01/11/13 1906  Gwyneth Sprout, MD 01/11/13 [phone]

## 2013-01-11 NOTE — ED Notes (Signed)
Pt reports Right Knee pain x2 days, states she was here Tuesday for Left knee pain, also reports she has a hx of arthritis, denies injury to extremity

## 2013-01-11 NOTE — ED Notes (Signed)
Pt now c/o nausea and weakness. PA ordered pt moved.

## 2013-01-11 NOTE — ED Notes (Signed)
Case mgmt at bedside.

## 2013-01-11 NOTE — Progress Notes (Signed)
01/11/13  5:01p Beecher Mcardle RN BSN [phone] 3:1 chair delivered to room. No further CM needs identified.  01/11/13  4:31p Beecher Mcardle RN BSN [phone] ED CM consulted to meet with patient by Dr. Anitra Lauth regarding [facility]. Pt present to ED with weakness and rt knee pain with difficulty walking. In to meet with patient with son at bedside. Requested permission to speak with patient in the presence of son. Pt lives at home with son. Pt states, that she is weak and having difficulties ambulating. Pt has a walker at home, but states she is having difficulties using it without assistance. Discussed HH, patient is agreeable. Pt states she has had services in the past.  [facility] Choice offered, patient decided on [facility]. Referral faxed [phone] for RN, PT, OT,HHA, and SW, to Intake office. Fax confirmation received. Pt also requesting a potty chair, DME Referral called in to [facility]. Chair will be delivered to ED. Pt reports that she no longer drives, and may need assistance getting to her doctor's appointment. Provided [employer] number to contact. Discussed with patient and son that someone from [facility] will contact them within 24-48 hours at the number provided in record. Patient verbalized understanding and was appreciative and agrees with disposition plan. Discussed dispo plan with Melissa RN and Dr. Anitra Lauth both agree with the plan. When patient cleared for discharge, Pt will be transported from ED in private vehicle.

## 2013-01-13 NOTE — ED Provider Notes (Addendum)
CSN: [id number]     Arrival date & time 01/08/13  1058 History   First MD Initiated Contact with Patient 01/08/13 1111     Chief Complaint  Patient presents with  . Knee Pain   (Consider location/radiation/quality/duration/timing/severity/associated sxs/prior Treatment) HPI  72 year old female with bilateral knee pain, left worse than right. Denies trauma. Pain is worse when ambulating. Mild ache at rest. No swelling. No rash. No acute numbness, tingling or loss of strength. No fevers or chills. Has not tried taking anything for pain. On review of systems, patient is also complaining of some mild chest pressure/burning. She typically noticed this at night when she lays in bed. She has noticed this during the day as well, but to a lesser degree. Has not noticed any worsening with exertion. No shortness of breath. Associated diaphoresis or nausea. Patient previously on Nexium, but states that she ran out and has not had for approximately the past month.  Past Medical History  Diagnosis Date  . Hypertension   . Diabetes mellitus   . TIA (transient ischemic attack)   . Hyperlipidemia   . Arthritis    Past Surgical History  Procedure Laterality Date  . Cholecystectomy    . Abdominal hysterectomy     No family history on file. History  Substance Use Topics  . Smoking status: Never Smoker   . Smokeless tobacco: Not on file  . Alcohol Use: No   OB History   Grav Para Term Preterm Abortions TAB SAB Ect Mult Living                 Review of Systems  All systems reviewed and negative, other than as noted in HPI.   Allergies  Metformin and related  Home Medications   Current Outpatient Rx  Name  Route  Sig  Dispense  Refill  . aspirin EC 81 MG tablet   Oral   Take 81 mg by mouth daily.         . colchicine 0.6 MG tablet   Oral   Take 0.6 mg by mouth daily as needed (for gout).         Marland Kitchen glimepiride (AMARYL) 4 MG tablet   Oral   Take 4 mg by mouth daily with  breakfast.         . lisinopril-hydrochlorothiazide (PRINZIDE,ZESTORETIC) 20-25 MG per tablet   Oral   Take 1 tablet by mouth daily.         . diazepam (VALIUM) 5 MG tablet   Oral   Take 1 tablet (5 mg total) by mouth every 12 (twelve) hours as needed for muscle spasms.   10 tablet   0   . esomeprazole (NEXIUM) 40 MG capsule   Oral   Take 1 capsule (40 mg total) by mouth daily.   30 capsule   0   . ondansetron (ZOFRAN) 4 MG tablet   Oral   Take 1 tablet (4 mg total) by mouth every 8 (eight) hours as needed for nausea (take 1 tablet before pain meds to prevent nausea).   20 tablet   0   . oxyCODONE-acetaminophen (PERCOCET/ROXICET) 5-325 MG per tablet   Oral   Take 1 tablet by mouth every 6 (six) hours as needed for moderate pain or severe pain.   15 tablet   0   . traMADol (ULTRAM) 50 MG tablet   Oral   Take 1 tablet (50 mg total) by mouth every 6 (six) hours as needed.  15 tablet   0    BP 179/65  Pulse 72  Temp(Src) 98.2 F (36.8 C) (Oral)  Resp 18  Ht 5' 2.5" (1.588 m)  Wt 203 lb 6.4 oz (92.262 kg)  BMI 36.59 kg/m2  SpO2 97% Physical Exam  Nursing note and vitals reviewed. Constitutional: She appears well-developed and well-nourished. No distress.  HENT:  Head: Normocephalic and atraumatic.  Eyes: Conjunctivae are normal. Right eye exhibits no discharge. Left eye exhibits no discharge.  Neck: Neck supple.  Cardiovascular: Normal rate, regular rhythm and normal heart sounds.  Exam reveals no gallop and no friction rub.   No murmur heard. Pulmonary/Chest: Effort normal and breath sounds normal. No respiratory distress. She exhibits no tenderness.  Abdominal: Soft. She exhibits no distension. There is no tenderness.  Musculoskeletal: She exhibits no edema and no tenderness.  Mild tenderness to palpation b/l knees, worse medial aspect. No swelling/effusion. No rash. No calf tenderness. Legs symmetric. NVI distally.   Neurological: She is alert.   Skin: Skin is warm and dry.  Psychiatric: She has a normal mood and affect. Her behavior is normal. Thought content normal.    ED Course  Procedures (including critical care time) Labs Review Labs Reviewed  COMPREHENSIVE METABOLIC PANEL - Abnormal; Notable for the following:    Glucose, Bld 144 (*)    Creatinine, Ser 1.33 (*)    Total Bilirubin 0.1 (*)    GFR calc non Af Amer 39 (*)    GFR calc Af Amer 45 (*)    All other components within normal limits  GLUCOSE, CAPILLARY - Abnormal; Notable for the following:    Glucose-Capillary 143 (*)    All other components within normal limits  CBC WITH DIFFERENTIAL  POCT I-STAT TROPONIN I   Imaging Review Dg Knee Complete 4 Views Right  01/11/2013   CLINICAL DATA:  Pain.  EXAM: RIGHT KNEE - COMPLETE 4+ VIEW  COMPARISON:  None.  FINDINGS: Patellofemoral femoral tibial degenerative change present. There is potentially prominent loss of joint space along the medial compartment. Vascular calcification consistent with atherosclerotic vascular disease present.  IMPRESSION: Prominent degenerative change particularly the medial compartment.   Electronically Signed   By: Maisie Fus  Register   On: 01/11/2013 14:37    EKG Interpretation     Ventricular Rate:  95 PR Interval:  146 QRS Duration: 76 QT Interval:  354 QTC Calculation: 444 R Axis:   64 Text Interpretation:  Normal sinus rhythm Nonspecific T wave abnormality Abnormal ECG ED PHYSICIAN INTERPRETATION AVAILABLE IN [facility]            MDM   1. Knee pain, unspecified laterality   2. Chest pain    72yF with cc of b/l knee pain, L>R. Worse medially on exam which corresponds with imaging. Consistent with DJD. Doubt infectious or thrombosis. Symptomatic control. Would likley benefit from weight loss. In terms of her chest pain, this is atypical. Pain is actually worse at night and laying in bed. No change with exertion. No associated shortness of breath, diaphoresis. Pain does  not radiate. EKG without overt ischemic changes. Possibly reflux given her description of the symptoms, stopping her PPI approximately one month ago, and symptoms worsen when supine.    Raeford Razor, MD 01/13/13 1118  Raeford Razor, MD 01/29/13 [phone]

## 2013-04-04 ENCOUNTER — Emergency Department (HOSPITAL_COMMUNITY)
Admission: EM | Admit: 2013-04-04 | Discharge: 2013-04-04 | Disposition: A | Discharge status: Home / Self Care | Payer: Medicare Other | Attending: Emergency Medicine | Admitting: Emergency Medicine

## 2013-04-04 ENCOUNTER — Encounter (HOSPITAL_COMMUNITY): Payer: Self-pay | Admitting: Emergency Medicine

## 2013-04-04 ENCOUNTER — Emergency Department (HOSPITAL_COMMUNITY): Payer: Medicare Other

## 2013-04-04 DIAGNOSIS — M129 Arthropathy, unspecified: Secondary | ICD-10-CM | POA: Insufficient documentation

## 2013-04-04 DIAGNOSIS — I10 Essential (primary) hypertension: Secondary | ICD-10-CM | POA: Insufficient documentation

## 2013-04-04 DIAGNOSIS — Z87442 Personal history of urinary calculi: Secondary | ICD-10-CM | POA: Insufficient documentation

## 2013-04-04 DIAGNOSIS — Z90710 Acquired absence of both cervix and uterus: Secondary | ICD-10-CM | POA: Insufficient documentation

## 2013-04-04 DIAGNOSIS — Z7982 Long term (current) use of aspirin: Secondary | ICD-10-CM | POA: Insufficient documentation

## 2013-04-04 DIAGNOSIS — Z8673 Personal history of transient ischemic attack (TIA), and cerebral infarction without residual deficits: Secondary | ICD-10-CM | POA: Insufficient documentation

## 2013-04-04 DIAGNOSIS — Z79899 Other long term (current) drug therapy: Secondary | ICD-10-CM | POA: Insufficient documentation

## 2013-04-04 DIAGNOSIS — R1031 Right lower quadrant pain: Secondary | ICD-10-CM | POA: Insufficient documentation

## 2013-04-04 DIAGNOSIS — R109 Unspecified abdominal pain: Secondary | ICD-10-CM

## 2013-04-04 DIAGNOSIS — Z9089 Acquired absence of other organs: Secondary | ICD-10-CM | POA: Insufficient documentation

## 2013-04-04 DIAGNOSIS — E119 Type 2 diabetes mellitus without complications: Secondary | ICD-10-CM | POA: Insufficient documentation

## 2013-04-04 LAB — URINALYSIS, ROUTINE W REFLEX MICROSCOPIC
Bilirubin Urine: NEGATIVE
GLUCOSE, UA: NEGATIVE mg/dL
KETONES UR: NEGATIVE mg/dL
Leukocytes, UA: NEGATIVE
Nitrite: NEGATIVE
Protein, ur: 100 mg/dL — AB
Specific Gravity, Urine: 1.021 (ref 1.005–1.030)
Urobilinogen, UA: 0.2 mg/dL (ref 0.0–1.0)
pH: 5 (ref 5.0–8.0)

## 2013-04-04 LAB — BASIC METABOLIC PANEL
BUN: 17 mg/dL (ref 6–23)
CALCIUM: 9.4 mg/dL (ref 8.4–10.5)
CO2: 26 mEq/L (ref 19–32)
CREATININE: 1.19 mg/dL — AB (ref 0.50–1.10)
Chloride: 99 mEq/L (ref 96–112)
GFR, EST AFRICAN AMERICAN: 52 mL/min — AB (ref >90)
GFR, EST NON AFRICAN AMERICAN: 44 mL/min — AB (ref >90)
Glucose, Bld: 195 mg/dL — ABNORMAL HIGH (ref 70–99)
Potassium: 3.6 mEq/L — ABNORMAL LOW (ref 3.7–5.3)
Sodium: 139 mEq/L (ref 137–147)

## 2013-04-04 LAB — URINE MICROSCOPIC-ADD ON

## 2013-04-04 LAB — CBC WITH DIFFERENTIAL/PLATELET
BASOS ABS: 0 10*3/uL (ref 0.0–0.1)
BASOS PCT: 0 % (ref 0–1)
EOS ABS: 0.1 10*3/uL (ref 0.0–0.7)
Eosinophils Relative: 1 % (ref 0–5)
HEMATOCRIT: 33.3 % — AB (ref 36.0–46.0)
Hemoglobin: 11.2 g/dL — ABNORMAL LOW (ref 12.0–15.0)
Lymphocytes Relative: 20 % (ref 12–46)
Lymphs Abs: 2.1 10*3/uL (ref 0.7–4.0)
MCH: 27.3 pg (ref 26.0–34.0)
MCHC: 33.6 g/dL (ref 30.0–36.0)
MCV: 81 fL (ref 78.0–100.0)
MONO ABS: 0.4 10*3/uL (ref 0.1–1.0)
MONOS PCT: 4 % (ref 3–12)
Neutro Abs: 7.8 10*3/uL — ABNORMAL HIGH (ref 1.7–7.7)
Neutrophils Relative %: 75 % (ref 43–77)
Platelets: 237 10*3/uL (ref 150–400)
RBC: 4.11 MIL/uL (ref 3.87–5.11)
RDW: 16.9 % — ABNORMAL HIGH (ref 11.5–15.5)
WBC: 10.4 10*3/uL (ref 4.0–10.5)

## 2013-04-04 LAB — URIC ACID: Uric Acid, Serum: 7.4 mg/dL — ABNORMAL HIGH (ref 2.4–7.0)

## 2013-04-04 MED ORDER — MORPHINE SULFATE 4 MG/ML IJ SOLN
4.0000 mg | Freq: Once | INTRAMUSCULAR | Status: AC
  Administered 2013-04-04: 4 mg via INTRAVENOUS
  Filled 2013-04-04: qty 1

## 2013-04-04 MED ORDER — ONDANSETRON HCL 4 MG/2ML IJ SOLN
4.0000 mg | Freq: Once | INTRAMUSCULAR | Status: AC
  Administered 2013-04-04: 4 mg via INTRAMUSCULAR
  Filled 2013-04-04: qty 2

## 2013-04-04 MED ORDER — ONDANSETRON HCL 4 MG PO TABS: 4.0000 mg | ORAL_TABLET | Freq: Three times a day (TID) | ORAL | Status: DC | PRN

## 2013-04-04 MED ORDER — SODIUM CHLORIDE 0.9 % IV SOLN
INTRAVENOUS | Status: DC
  Administered 2013-04-04: 09:00:00 via INTRAVENOUS

## 2013-04-04 MED ORDER — ONDANSETRON HCL 4 MG/2ML IJ SOLN
4.0000 mg | Freq: Four times a day (QID) | INTRAMUSCULAR | Status: DC | PRN
  Administered 2013-04-04: 4 mg via INTRAVENOUS
  Filled 2013-04-04: qty 2

## 2013-04-04 MED ORDER — OXYCODONE-ACETAMINOPHEN 5-325 MG PO TABS: ORAL_TABLET | ORAL | Status: DC

## 2013-04-04 NOTE — Discharge Instructions (Signed)
Drink plenty of fluids. Dr Vernie Ammonsttelin, a Urologist has reviewed your CT scan and he doesn't feel you need to be evaluated by a urologist. He states the narrowing in the tube draining your right kidney isn't causing enough of a blockage to be a problem. Take the percocet for pain, use the zofran for nausea. Recheck if you get a fever, or have uncontrolled vomiting or pain.

## 2013-04-04 NOTE — ED Notes (Signed)
Patient woke up this morning at 0400 with severe abdominal pain.  Nausea, denies vomiting and diarrhea. Denies aches and chills, afebrile, not diaphoretic

## 2013-04-04 NOTE — ED Notes (Signed)
Lab called and confirmed that a uric acid specimen could be pulled from previous labs sent.

## 2013-04-04 NOTE — ED Provider Notes (Signed)
CSN: [id number]     Arrival date & time 04/04/13  0808 History   First MD Initiated Contact with Patient 04/04/13 [phone]     Chief Complaint  Patient presents with  . Abdominal Pain   (Consider location/radiation/quality/duration/timing/severity/associated sxs/prior Treatment) HPI Patient reports during the night she started getting pain in her right abdomen that radiates into her right flank. She states the pain currently is radiating into her right lower quadrant/groin. She has had nausea without vomiting or diarrhea. She states the pain is constant but waxes and wanes. She states the pain is a bloated feeling with sharpness. She denies hematuria, urinary symptoms such as dysuria or frequency, fever. She states she has had the urge to have a bowel movement however she's not been able to have a bowel movement. She states this pain feels similar to when she had a kidney stone in the past. She states she has had about 3 kidney stones however she's never had to see a urologist. She reports moderate caffeine intake. She denies milk ingestion   PCP Dr Concepcion ElkAvbuere  Past Medical History  Diagnosis Date  . Hypertension   . Diabetes mellitus   . TIA (transient ischemic attack)   . Hyperlipidemia   . Arthritis    Past Surgical History  Procedure Laterality Date  . Cholecystectomy    . Abdominal hysterectomy     No family history on file. History  Substance Use Topics  . Smoking status: Never Smoker   . Smokeless tobacco: Not on file  . Alcohol Use: No   Lives alone  OB History   Grav Para Term Preterm Abortions TAB SAB Ect Mult Living                 Review of Systems  All other systems reviewed and are negative.    Allergies  Metformin and related  Home Medications   Current Outpatient Rx  Name  Route  Sig  Dispense  Refill  . aspirin EC 81 MG tablet   Oral   Take 81 mg by mouth daily.         . colchicine 0.6 MG tablet   Oral   Take 0.6 mg by mouth daily as needed (for  gout).         . diazepam (VALIUM) 5 MG tablet   Oral   Take 1 tablet (5 mg total) by mouth every 12 (twelve) hours as needed for muscle spasms.   10 tablet   0   . esomeprazole (NEXIUM) 40 MG capsule   Oral   Take 1 capsule (40 mg total) by mouth daily.   30 capsule   0   . glimepiride (AMARYL) 4 MG tablet   Oral   Take 4 mg by mouth daily with breakfast.         . lisinopril-hydrochlorothiazide (PRINZIDE,ZESTORETIC) 20-25 MG per tablet   Oral   Take 1 tablet by mouth daily.         . ondansetron (ZOFRAN) 4 MG tablet   Oral   Take 1 tablet (4 mg total) by mouth every 8 (eight) hours as needed for nausea (take 1 tablet 20min before pain meds to prevent nausea).   20 tablet   0   . oxyCODONE-acetaminophen (PERCOCET/ROXICET) 5-325 MG per tablet   Oral   Take 1 tablet by mouth every 6 (six) hours as needed for moderate pain or severe pain.   15 tablet   0   . traMADol (ULTRAM) 50  MG tablet   Oral   Take 1 tablet (50 mg total) by mouth every 6 (six) hours as needed.   15 tablet   0    Pulse 85  Temp(Src) 98.2 F (36.8 C) (Oral)  Resp 21  Ht 5\' 2"  (1.575 m)  Wt 190 lb (86.183 kg)  BMI 34.74 kg/m2  SpO2 99%  Vital signs normal   Physical Exam  Nursing note and vitals reviewed. Constitutional: She is oriented to person, place, and time. She appears well-developed and well-nourished.  Non-toxic appearance. She does not appear ill. She appears distressed.  Appears uncomfortable  HENT:  Head: Normocephalic and atraumatic.  Right Ear: External ear normal.  Left Ear: External ear normal.  Nose: Nose normal. No mucosal edema or rhinorrhea.  Mouth/Throat: Oropharynx is clear and moist and mucous membranes are normal. No dental abscesses or uvula swelling.  Eyes: Conjunctivae and EOM are normal. Pupils are equal, round, and reactive to light.  Neck: Normal range of motion and full passive range of motion without pain. Neck supple.  Cardiovascular: Normal rate,  regular rhythm and normal heart sounds.  Exam reveals no gallop and no friction rub.   No murmur heard. Pulmonary/Chest: Effort normal and breath sounds normal. No respiratory distress. She has no wheezes. She has no rhonchi. She has no rales. She exhibits no tenderness and no crepitus.  Abdominal: Soft. Normal appearance and bowel sounds are normal. She exhibits no distension. There is tenderness. There is no rebound and no guarding.    No CVAT on right  Musculoskeletal: Normal range of motion. She exhibits no edema and no tenderness.  Moves all extremities well.   Neurological: She is alert and oriented to person, place, and time. She has normal strength. No cranial nerve deficit.  Skin: Skin is warm, dry and intact. No rash noted. No erythema. No pallor.  Psychiatric: She has a normal mood and affect. Her speech is normal and behavior is normal. Her mood appears not anxious.    ED Course  Procedures (including critical care time)  Medications  0.9 %  sodium chloride infusion ( Intravenous New Bag/Given 04/04/13 0843)  ondansetron (ZOFRAN) injection 4 mg (4 mg Intravenous Given 04/04/13 0853)  morphine 4 MG/ML injection 4 mg (4 mg Intravenous Given 04/04/13 0848)  morphine 4 MG/ML injection 4 mg (4 mg Intravenous Given 04/04/13 1031)    10:00 still having pain, second dose of morphine ordered.   12:05 pt states her pain is almost gone, feeling better. States she has gout and is taking allopurinol and colchicine already. States she lives alone and is afraid to go home because of fear of recurrence of pain.   12:39 Dr Vernie Ammons, is going to look at her CT scan and call me back.   13:02 Dr Vernie Ammons has reviewed her CT scan. He states he feels the narrowing in the ureter is chronic and non-obstructive because her renal parenchyma is normal and her creatinine level is improved. He also states even uric acid renal stones can be visualized and he agrees there isn't a ureteral stone present. He doesn't  feel she needs to be seen by urology.    August 13, 2011 CT ABDOMEN AND PELVIS WITH CONTRAST  IMPRESSION:  Moderate right hydronephrosis, increased, with dilatation of the  proximal right ureter and surrounding periureteral stranding. No  definite distal ureteral calculus is seen.  These findings could reflect recent passage of a ureteral calculus  or possibly a distal ureteral stricture related to  prior calculus.  Normal appendix. No evidence of bowel obstruction.  Original Report Authenticated By: Charline Bills, M.D.   Labs Review Results for orders placed during the hospital encounter of 04/04/13  URINALYSIS, ROUTINE W REFLEX MICROSCOPIC      Result Value Range   Color, Urine YELLOW  YELLOW   APPearance TURBID (*) CLEAR   Specific Gravity, Urine 1.021  1.005 - 1.030   pH 5.0  5.0 - 8.0   Glucose, UA NEGATIVE  NEGATIVE mg/dL   Hgb urine dipstick LARGE (*) NEGATIVE   Bilirubin Urine NEGATIVE  NEGATIVE   Ketones, ur NEGATIVE  NEGATIVE mg/dL   Protein, ur 956 (*) NEGATIVE mg/dL   Urobilinogen, UA 0.2  0.0 - 1.0 mg/dL   Nitrite NEGATIVE  NEGATIVE   Leukocytes, UA NEGATIVE  NEGATIVE  CBC WITH DIFFERENTIAL      Result Value Range   WBC 10.4  4.0 - 10.5 K/uL   RBC 4.11  3.87 - 5.11 MIL/uL   Hemoglobin 11.2 (*) 12.0 - 15.0 g/dL   HCT 21.3 (*) 08.6 - 57.8 %   MCV 81.0  78.0 - 100.0 fL   MCH 27.3  26.0 - 34.0 pg   MCHC 33.6  30.0 - 36.0 g/dL   RDW 46.9 (*) 62.9 - 52.8 %   Platelets 237  150 - 400 K/uL   Neutrophils Relative % 75  43 - 77 %   Neutro Abs 7.8 (*) 1.7 - 7.7 K/uL   Lymphocytes Relative 20  12 - 46 %   Lymphs Abs 2.1  0.7 - 4.0 K/uL   Monocytes Relative 4  3 - 12 %   Monocytes Absolute 0.4  0.1 - 1.0 K/uL   Eosinophils Relative 1  0 - 5 %   Eosinophils Absolute 0.1  0.0 - 0.7 K/uL   Basophils Relative 0  0 - 1 %   Basophils Absolute 0.0  0.0 - 0.1 K/uL  BASIC METABOLIC PANEL      Result Value Range   Sodium 139  137 - 147 mEq/L   Potassium 3.6 (*) 3.7 - 5.3  mEq/L   Chloride 99  96 - 112 mEq/L   CO2 26  19 - 32 mEq/L   Glucose, Bld 195 (*) 70 - 99 mg/dL   BUN 17  6 - 23 mg/dL   Creatinine, Ser 4.13 (*) 0.50 - 1.10 mg/dL   Calcium 9.4  8.4 - 24.4 mg/dL   GFR calc non Af Amer 44 (*) >90 mL/min   GFR calc Af Amer 52 (*) >90 mL/min  URINE MICROSCOPIC-ADD ON      Result Value Range   Squamous Epithelial / LPF FEW (*) RARE   RBC / HPF 21-50  <3 RBC/hpf   Bacteria, UA RARE  RARE   Crystals URIC ACID CRYSTALS (*) NEGATIVE  URIC ACID      Result Value Range   Uric Acid, Serum 7.4 (*) 2.4 - 7.0 mg/dL   Laboratory interpretation all normal except elevated uric acid, improved renal insuffic    Imaging Review Ct Abdomen Pelvis Wo Contrast  04/04/2013   CLINICAL DATA:  Severe abdominal pain.  With nausea  EXAM: CT ABDOMEN AND PELVIS WITHOUT CONTRAST  TECHNIQUE: Multidetector CT imaging of the abdomen and pelvis was performed following the standard protocol without intravenous contrast.  COMPARISON:  CT ABD/PELVIS W CM dated 08/13/2011  FINDINGS: The liver exhibits no focal mass nor ductal dilation. The gallbladder is surgically absent. The stomach is moderately  distended with fluid and some gas. There is a small hiatal hernia. The spleen, pancreas, and right adrenal gland are normal in appearance. There is mild fullness of the medial limb of the left adrenal gland which is stable. The caliber of the abdominal aorta is normal. There is no periaortic or pericaval lymphadenopathy.  There is moderate right-sided hydronephrosis and proximal and mid hydroureter which is stable. There is a nonobstructing lower pole stone or pair of stones measuring up to 4 mm in greatest dimension. Within the dilated right ureter, which tapers to a fairly normal caliber in its distal third, no definite obstructing stone is demonstrated. There are numerous arterial calcifications and phleboliths within the pelvis similar to those previously demonstrated. The left kidney exhibits no  evidence of stones or obstruction. There is no hydroureter on the left. The partially distended urinary bladder is normal in appearance.  The unopacified loops of small and large bowel exhibit no evidence of ileus nor obstruction. The appendix is normal in appearance. There is no evidence of colitis or diverticulitis. There is no inguinal or umbilical hernia. The uterus is surgically absent. There are no adnexal masses. There is no evidence of ascites.  The lumbar vertebral bodies are preserved in height. The bony pelvis exhibits no acute abnormalities. The lung bases exhibit minimal scarring and pleural thickening.  IMPRESSION: 1. There is mild hydronephrosis and proximal and mid hydroureter on the right without definite obstructing stones or other processes. These findings are unchanged from the study of August 13, 2011. The etiology for the distal ureteral narrowing is unclear and may be related to a stricture with recent passage of a tiny stone. There are nonobstructing stones in the lower pole of the right kidney today. 2. The left kidney and urinary bladder and remainder of the abdominal viscera appear normal. 3. There is no evidence of acute bowel abnormality. 4. Urologic consultation is recommended.   Electronically Signed   By: David  [place]   On: 04/04/2013 09:29    EKG Interpretation   None       MDM   1. Abdominal pain     New Prescriptions   ONDANSETRON (ZOFRAN) 4 MG TABLET    Take 1 tablet (4 mg total) by mouth every 8 (eight) hours as needed for nausea or vomiting.   OXYCODONE-ACETAMINOPHEN (PERCOCET/ROXICET) 5-325 MG PER TABLET    Take 1 or 2 po Q 6hrs for pain     Plan discharge   Devoria Albe, MD, Franz Dell, MD 04/04/13 1314

## 2013-04-04 NOTE — ED Notes (Signed)
Pt vomiting during discharge.  MD notified and zofran IM orders placed.

## 2013-04-29 ENCOUNTER — Ambulatory Visit (INDEPENDENT_AMBULATORY_CARE_PROVIDER_SITE_OTHER): Payer: Medicare Other

## 2013-04-29 ENCOUNTER — Encounter: Payer: Self-pay | Admitting: Podiatry

## 2013-04-29 ENCOUNTER — Ambulatory Visit (INDEPENDENT_AMBULATORY_CARE_PROVIDER_SITE_OTHER): Payer: Medicare Other | Admitting: Podiatry

## 2013-04-29 VITALS — BP 190/104 | HR 116 | Temp 99.1°F | Resp 18

## 2013-04-29 DIAGNOSIS — R52 Pain, unspecified: Secondary | ICD-10-CM

## 2013-04-29 DIAGNOSIS — L02619 Cutaneous abscess of unspecified foot: Secondary | ICD-10-CM

## 2013-04-29 DIAGNOSIS — L03119 Cellulitis of unspecified part of limb: Secondary | ICD-10-CM

## 2013-04-29 MED ORDER — CEPHALEXIN 500 MG PO CAPS: 500.0000 mg | ORAL_CAPSULE | Freq: Four times a day (QID) | ORAL | Status: DC

## 2013-04-29 NOTE — Patient Instructions (Signed)
Present to lab for blood work. Take the antibiotics by mouth 14 times a day. Wear the surgical shoe when walking.

## 2013-04-29 NOTE — Progress Notes (Signed)
   Subjective:    Patient ID: [name], female   Macky Lower DOB: 1940/11/05, 73 y.o.   MRN: [id number]  HPI left foot is swollen and has been going on for about 10 days and Dr. Jearld LeschAvebue and I have a history of gout and that is what he said I had and this foot has not gone down and is tender and he put me on some medicine and after I read the side effects I did not take it. The medicine prescribed apparently was allopurinol and colchicine. He said she was diagnosed with gout.   Review of Systems  Endocrine: Positive for cold intolerance and heat intolerance.  Skin:       Dark color on left foot  Neurological: Positive for dizziness and headaches.  All other systems reviewed and are negative.       Objective:   Physical Exam  Orientated [phone] 73 year old black female presents with her son. She transfers from wheelchair the treatment chair.  Vascular: The DP is are 4/4 bilaterally. PTs are 2/4 bilaterally  Neurological sensation to 10 g monofilament wire intact 5/5 right and 4/5 left. Vibratory sensation intact bilaterally  Dermatological: Edema noted in the left forefoot area with mild increase of warmth without any obvious skin lesions.  Musculoskeletal: HAV deformity noted right. Patient not able to weight-bear because of painful knees.  : X-ray report left foot nonweightbearing  Increased soft tissue density noted in around the first MPJ. No emphysema noted. HAV deformity present.  Radiographic impression: No acute bony abnormality noted Increased soft tissue density noted in the first MPJ area.        Assessment & Plan:   Assessment: Gout versus cellulitis left foot  Plan patient advised to take the colchicine and allopurinol as prescribed. Cephalexin 500 mg 4 times a day x7 days prescribed Surgical shoe dispensed to wear on left foot  Patient referred to the lab for CBC with differential, sedimentation rate, and serum uric acid.  Reappoint x7 days

## 2013-04-30 ENCOUNTER — Encounter: Payer: Self-pay | Admitting: Podiatry

## 2013-04-30 LAB — CBC WITH DIFFERENTIAL/PLATELET
Basophils Absolute: 0.1 10*3/uL (ref 0.0–0.1)
Basophils Relative: 1 % (ref 0–1)
Eosinophils Absolute: 0.2 10*3/uL (ref 0.0–0.7)
Eosinophils Relative: 3 % (ref 0–5)
HEMATOCRIT: 34.5 % — AB (ref 36.0–46.0)
HEMOGLOBIN: 10.9 g/dL — AB (ref 12.0–15.0)
Lymphocytes Relative: 26 % (ref 12–46)
Lymphs Abs: 1.6 10*3/uL (ref 0.7–4.0)
MCH: 26 pg (ref 26.0–34.0)
MCHC: 31.6 g/dL (ref 30.0–36.0)
MCV: 82.1 fL (ref 78.0–100.0)
MONO ABS: 0.4 10*3/uL (ref 0.1–1.0)
MONOS PCT: 6 % (ref 3–12)
NEUTROS ABS: 3.9 10*3/uL (ref 1.7–7.7)
Neutrophils Relative %: 64 % (ref 43–77)
Platelets: 260 10*3/uL (ref 150–400)
RBC: 4.2 MIL/uL (ref 3.87–5.11)
RDW: 16.7 % — ABNORMAL HIGH (ref 11.5–15.5)
WBC: 6.1 10*3/uL (ref 4.0–10.5)

## 2013-04-30 LAB — URIC ACID: Uric Acid, Serum: 10 mg/dL — ABNORMAL HIGH (ref 2.4–7.0)

## 2013-05-01 ENCOUNTER — Telehealth: Payer: Self-pay | Admitting: *Deleted

## 2013-05-01 LAB — SEDIMENTATION RATE: Sed Rate: 63 mm/hr — ABNORMAL HIGH (ref 0–22)

## 2013-05-01 NOTE — Telephone Encounter (Signed)
Message copied by Marissa Nestle'CONNELL, Asah Lamay D on Wed May 01, 2013  9:47 AM ------      Message from: Carrington ClampUCHMAN, RICHARD C      Created: Wed May 01, 2013  8:18 AM       Lab result dated 04/29/2013. Sedimentation mildly elevated. Uric acid elevated. WBCs mildly elevated. Neutrophils elevated            Lab is most consistent with gout inflammation, however, cannot rule out infection      Contact patient and make sure she is taking her colchicine that was prescribed by her medical doctor as well as a cephalexin that I prescribed.       ------

## 2013-05-01 NOTE — Telephone Encounter (Signed)
Left message to call for Dr Theotis Burrowuchman's review and orders for bloodwork 04/29/2013.

## 2013-05-06 ENCOUNTER — Ambulatory Visit (INDEPENDENT_AMBULATORY_CARE_PROVIDER_SITE_OTHER): Payer: Medicare Other | Admitting: Podiatry

## 2013-05-06 ENCOUNTER — Encounter: Payer: Self-pay | Admitting: Podiatry

## 2013-05-06 VITALS — BP 196/99 | HR 100 | Resp 18

## 2013-05-06 DIAGNOSIS — M109 Gout, unspecified: Secondary | ICD-10-CM

## 2013-05-06 NOTE — Patient Instructions (Signed)
Continue to take 1 colcrys daily until the pain and swelling end. Return to your medical doctor for followup care for gout inflammation and and maintenance. If the gout inflammation recurs you can restart the colcrys until the pain ends

## 2013-05-07 NOTE — Progress Notes (Signed)
Patient ID: Sophia LowerDorothy M [name], female   DOB: March 24, 1940, 73 y.o.   MRN: [id number]  Subjective: This patient presents for followup care from the visit of 04/30/2013. At that time patient was advised to take cephalexin 4 times a day that I prescribed and 2 take the previous prescribed colchicine and allopurinol prescribe are her internist. Patient states that she did not take the cephalexin because she was fearful of the side effects. She admits that she took the colchicine, however not the allopurinol.  Objective: The left first MPJ is mildly edematous, without any warmth or open lesions. Left first MPJ is mildly tender to palpation and range of motion. A left bunion deformity is noted.  Lab report of 04/29/2013 demonstrated elevated uric acid, sedimentation rate and minor variations in the CBC.  Assessment: Resolving gouty arthritis left foot Patient is noncompliant with instructions to take medication.  Plan: I advised patient to  take 1 colchicine daily until her foot pain is totally resolved. I had a detailed  discussion with her about side effects , to take medication as prescribed and advised her to contact her internist for followup care for her gouty arthritis. I provided her with a copy of her recent lab work including CBC, sedimentation rate and  uric acid to give to her internist. She has an appointment with her internist within the next week.  Reappoint at patient's request

## 2013-05-14 ENCOUNTER — Ambulatory Visit: Payer: Medicare Other | Attending: Internal Medicine

## 2013-05-14 DIAGNOSIS — R262 Difficulty in walking, not elsewhere classified: Secondary | ICD-10-CM | POA: Insufficient documentation

## 2013-05-14 DIAGNOSIS — IMO0001 Reserved for inherently not codable concepts without codable children: Secondary | ICD-10-CM | POA: Insufficient documentation

## 2013-05-14 DIAGNOSIS — M2550 Pain in unspecified joint: Secondary | ICD-10-CM | POA: Insufficient documentation

## 2013-05-14 DIAGNOSIS — M2560 Stiffness of unspecified joint, not elsewhere classified: Secondary | ICD-10-CM | POA: Insufficient documentation

## 2013-05-16 ENCOUNTER — Ambulatory Visit: Payer: Medicare Other | Admitting: Physical Therapy

## 2013-05-21 ENCOUNTER — Ambulatory Visit: Payer: Medicare Other

## 2013-05-23 ENCOUNTER — Ambulatory Visit: Payer: Medicare Other

## 2013-05-28 ENCOUNTER — Ambulatory Visit: Payer: Medicare Other

## 2013-05-30 ENCOUNTER — Ambulatory Visit: Payer: Medicare Other | Attending: Internal Medicine

## 2013-05-30 DIAGNOSIS — M2560 Stiffness of unspecified joint, not elsewhere classified: Secondary | ICD-10-CM | POA: Insufficient documentation

## 2013-05-30 DIAGNOSIS — M2550 Pain in unspecified joint: Secondary | ICD-10-CM | POA: Insufficient documentation

## 2013-05-30 DIAGNOSIS — IMO0001 Reserved for inherently not codable concepts without codable children: Secondary | ICD-10-CM | POA: Insufficient documentation

## 2013-05-30 DIAGNOSIS — R262 Difficulty in walking, not elsewhere classified: Secondary | ICD-10-CM | POA: Insufficient documentation

## 2013-06-28 DIAGNOSIS — N200 Calculus of kidney: Secondary | ICD-10-CM

## 2013-06-28 DIAGNOSIS — N135 Crossing vessel and stricture of ureter without hydronephrosis: Secondary | ICD-10-CM

## 2013-06-28 HISTORY — DX: Calculus of kidney: N20.0

## 2013-06-28 HISTORY — DX: Crossing vessel and stricture of ureter without hydronephrosis: N13.5

## 2013-08-12 ENCOUNTER — Other Ambulatory Visit: Payer: Self-pay

## 2013-08-12 DIAGNOSIS — Z1231 Encounter for screening mammogram for malignant neoplasm of breast: Secondary | ICD-10-CM

## 2013-08-16 ENCOUNTER — Ambulatory Visit
Admission: RE | Admit: 2013-08-16 | Discharge: 2013-08-16 | Disposition: A | Discharge status: Home / Self Care | Payer: Medicare Other | Source: Ambulatory Visit

## 2013-08-16 DIAGNOSIS — Z1231 Encounter for screening mammogram for malignant neoplasm of breast: Secondary | ICD-10-CM

## 2013-10-12 ENCOUNTER — Encounter (HOSPITAL_COMMUNITY): Payer: Self-pay | Admitting: Emergency Medicine

## 2013-10-12 ENCOUNTER — Emergency Department (HOSPITAL_COMMUNITY)
Admission: EM | Admit: 2013-10-12 | Discharge: 2013-10-12 | Disposition: A | Discharge status: Home / Self Care | Payer: Medicare Other | Attending: Emergency Medicine | Admitting: Emergency Medicine

## 2013-10-12 ENCOUNTER — Emergency Department (HOSPITAL_COMMUNITY): Payer: Medicare Other

## 2013-10-12 DIAGNOSIS — Z79899 Other long term (current) drug therapy: Secondary | ICD-10-CM | POA: Insufficient documentation

## 2013-10-12 DIAGNOSIS — Z7982 Long term (current) use of aspirin: Secondary | ICD-10-CM | POA: Insufficient documentation

## 2013-10-12 DIAGNOSIS — N1330 Unspecified hydronephrosis: Secondary | ICD-10-CM | POA: Diagnosis not present

## 2013-10-12 DIAGNOSIS — N211 Calculus in urethra: Secondary | ICD-10-CM | POA: Insufficient documentation

## 2013-10-12 DIAGNOSIS — I10 Essential (primary) hypertension: Secondary | ICD-10-CM | POA: Insufficient documentation

## 2013-10-12 DIAGNOSIS — Z8673 Personal history of transient ischemic attack (TIA), and cerebral infarction without residual deficits: Secondary | ICD-10-CM | POA: Diagnosis not present

## 2013-10-12 DIAGNOSIS — M129 Arthropathy, unspecified: Secondary | ICD-10-CM | POA: Insufficient documentation

## 2013-10-12 DIAGNOSIS — N132 Hydronephrosis with renal and ureteral calculous obstruction: Secondary | ICD-10-CM

## 2013-10-12 DIAGNOSIS — N200 Calculus of kidney: Secondary | ICD-10-CM | POA: Insufficient documentation

## 2013-10-12 DIAGNOSIS — Z90710 Acquired absence of both cervix and uterus: Secondary | ICD-10-CM | POA: Diagnosis not present

## 2013-10-12 DIAGNOSIS — E119 Type 2 diabetes mellitus without complications: Secondary | ICD-10-CM | POA: Diagnosis not present

## 2013-10-12 DIAGNOSIS — R1031 Right lower quadrant pain: Secondary | ICD-10-CM | POA: Diagnosis present

## 2013-10-12 DIAGNOSIS — Z9089 Acquired absence of other organs: Secondary | ICD-10-CM | POA: Insufficient documentation

## 2013-10-12 HISTORY — DX: Calculus of kidney: N20.0

## 2013-10-12 HISTORY — DX: Crossing vessel and stricture of ureter without hydronephrosis: N13.5

## 2013-10-12 LAB — URINALYSIS, ROUTINE W REFLEX MICROSCOPIC
BILIRUBIN URINE: NEGATIVE
GLUCOSE, UA: NEGATIVE mg/dL
KETONES UR: NEGATIVE mg/dL
Nitrite: NEGATIVE
PH: 5 (ref 5.0–8.0)
Protein, ur: 30 mg/dL — AB
SPECIFIC GRAVITY, URINE: 1.018 (ref 1.005–1.030)
Urobilinogen, UA: 0.2 mg/dL (ref 0.0–1.0)

## 2013-10-12 LAB — BASIC METABOLIC PANEL
Anion gap: 14 (ref 5–15)
BUN: 19 mg/dL (ref 6–23)
CO2: 25 meq/L (ref 19–32)
CREATININE: 1.32 mg/dL — AB (ref 0.50–1.10)
Calcium: 9.7 mg/dL (ref 8.4–10.5)
Chloride: 104 mEq/L (ref 96–112)
GFR calc Af Amer: 45 mL/min — ABNORMAL LOW (ref >90)
GFR calc non Af Amer: 39 mL/min — ABNORMAL LOW (ref >90)
GLUCOSE: 157 mg/dL — AB (ref 70–99)
Potassium: 4 mEq/L (ref 3.7–5.3)
Sodium: 143 mEq/L (ref 137–147)

## 2013-10-12 LAB — CBC WITH DIFFERENTIAL/PLATELET
Basophils Absolute: 0 10*3/uL (ref 0.0–0.1)
Basophils Relative: 1 % (ref 0–1)
EOS PCT: 3 % (ref 0–5)
Eosinophils Absolute: 0.2 10*3/uL (ref 0.0–0.7)
HCT: 33.4 % — ABNORMAL LOW (ref 36.0–46.0)
HEMOGLOBIN: 10.6 g/dL — AB (ref 12.0–15.0)
LYMPHS ABS: 1.5 10*3/uL (ref 0.7–4.0)
LYMPHS PCT: 25 % (ref 12–46)
MCH: 25.8 pg — ABNORMAL LOW (ref 26.0–34.0)
MCHC: 31.7 g/dL (ref 30.0–36.0)
MCV: 81.3 fL (ref 78.0–100.0)
MONO ABS: 0.3 10*3/uL (ref 0.1–1.0)
MONOS PCT: 4 % (ref 3–12)
Neutro Abs: 4.2 10*3/uL (ref 1.7–7.7)
Neutrophils Relative %: 67 % (ref 43–77)
Platelets: 251 10*3/uL (ref 150–400)
RBC: 4.11 MIL/uL (ref 3.87–5.11)
RDW: 15.4 % (ref 11.5–15.5)
WBC: 6.2 10*3/uL (ref 4.0–10.5)

## 2013-10-12 LAB — URINE MICROSCOPIC-ADD ON

## 2013-10-12 MED ORDER — FENTANYL CITRATE 0.05 MG/ML IJ SOLN
100.0000 ug | INTRAMUSCULAR | Status: DC | PRN
  Administered 2013-10-12 (×2): 100 ug via INTRAVENOUS
  Filled 2013-10-12 (×2): qty 2

## 2013-10-12 MED ORDER — HYDROCODONE-ACETAMINOPHEN 5-325 MG PO TABS: 2.0000 | ORAL_TABLET | ORAL | Status: DC | PRN

## 2013-10-12 MED ORDER — IOHEXOL 300 MG/ML  SOLN
100.0000 mL | Freq: Once | INTRAMUSCULAR | Status: AC | PRN
  Administered 2013-10-12: 100 mL via INTRAVENOUS

## 2013-10-12 MED ORDER — ONDANSETRON HCL 4 MG/2ML IJ SOLN
4.0000 mg | Freq: Once | INTRAMUSCULAR | Status: AC
  Administered 2013-10-12: 4 mg via INTRAVENOUS
  Filled 2013-10-12: qty 2

## 2013-10-12 MED ORDER — TAMSULOSIN HCL 0.4 MG PO CAPS: ORAL_CAPSULE | ORAL | Status: DC

## 2013-10-12 MED ORDER — IOHEXOL 300 MG/ML  SOLN
25.0000 mL | Freq: Once | INTRAMUSCULAR | Status: AC | PRN
  Administered 2013-10-12: 25 mL via ORAL

## 2013-10-12 MED ORDER — SODIUM CHLORIDE 0.9 % IV SOLN
INTRAVENOUS | Status: DC
  Administered 2013-10-12: 09:00:00 via INTRAVENOUS

## 2013-10-12 NOTE — Discharge Instructions (Signed)
Strain your urine, until, you catch a stone. Followup with the urologist, as directed,   Kidney Stones Kidney stones (urolithiasis) are deposits that form inside your kidneys. The intense pain is caused by the stone moving through the urinary tract. When the stone moves, the ureter goes into spasm around the stone. The stone is usually passed in the urine.  CAUSES   A disorder that makes certain neck glands produce too much parathyroid hormone (primary hyperparathyroidism).  A buildup of uric acid crystals, similar to gout in your joints.  Narrowing (stricture) of the ureter.  A kidney obstruction present at birth (congenital obstruction).  Previous surgery on the kidney or ureters.  Numerous kidney infections. SYMPTOMS   Feeling sick to your stomach (nauseous).  Throwing up (vomiting).  Blood in the urine (hematuria).  Pain that usually spreads (radiates) to the groin.  Frequency or urgency of urination. DIAGNOSIS   Taking a history and physical exam.  Blood or urine tests.  CT scan.  Occasionally, an examination of the inside of the urinary bladder (cystoscopy) is performed. TREATMENT   Observation.  Increasing your fluid intake.  Extracorporeal shock wave lithotripsy--This is a noninvasive procedure that uses shock waves to break up kidney stones.  Surgery may be needed if you have severe pain or persistent obstruction. There are various surgical procedures. Most of the procedures are performed with the use of small instruments. Only small incisions are needed to accommodate these instruments, so recovery time is minimized. The size, location, and chemical composition are all important variables that will determine the proper choice of action for you. Talk to your health care provider to better understand your situation so that you will minimize the risk of injury to yourself and your kidney.  HOME CARE INSTRUCTIONS   Drink enough water and fluids to keep your  urine clear or pale yellow. This will help you to pass the stone or stone fragments.  Strain all urine through the provided strainer. Keep all particulate matter and stones for your health care provider to see. The stone causing the pain may be as small as a grain of salt. It is very important to use the strainer each and every time you pass your urine. The collection of your stone will allow your health care provider to analyze it and verify that a stone has actually passed. The stone analysis will often identify what you can do to reduce the incidence of recurrences.  Only take over-the-counter or prescription medicines for pain, discomfort, or fever as directed by your health care provider.  Make a follow-up appointment with your health care provider as directed.  Get follow-up X-rays if required. The absence of pain does not always mean that the stone has passed. It may have only stopped moving. If the urine remains completely obstructed, it can cause loss of kidney function or even complete destruction of the kidney. It is your responsibility to make sure X-rays and follow-ups are completed. Ultrasounds of the kidney can show blockages and the status of the kidney. Ultrasounds are not associated with any radiation and can be performed easily in a matter of minutes. SEEK MEDICAL CARE IF:  You experience pain that is progressive and unresponsive to any pain medicine you have been prescribed. SEEK IMMEDIATE MEDICAL CARE IF:   Pain cannot be controlled with the prescribed medicine.  You have a fever or shaking chills.  The severity or intensity of pain increases over 18 hours and is not relieved by pain medicine.  You develop a new onset of abdominal pain.  You feel faint or pass out.  You are unable to urinate. MAKE SURE YOU:   Understand these instructions.  Will watch your condition.  Will get help right away if you are not doing well or get worse. Document Released: 02/14/2005  Document Revised: 10/17/2012 Document Reviewed: 07/18/2012 [facility] Patient Information 2015 [place], [place]. This information is not intended to replace advice given to you by your health care provider. Make sure you discuss any questions you have with your health care provider.  Ureteral Colic (Kidney Stones) Ureteral colic is the result of a condition when kidney stones form inside the kidney. Once kidney stones are formed they may move into the tube that connects the kidney with the bladder (ureter). If this occurs, this condition may cause pain (colic) in the ureter.  CAUSES  Pain is caused by stone movement in the ureter and the obstruction caused by the stone. SYMPTOMS  The pain comes and goes as the ureter contracts around the stone. The pain is usually intense, sharp, and stabbing in character. The location of the pain may move as the stone moves through the ureter. When the stone is near the kidney the pain is usually located in the back and radiates to the belly (abdomen). When the stone is ready to pass into the bladder the pain is often located in the lower abdomen on the side the stone is located. At this location, the symptoms may mimic those of a urinary tract infection with urinary frequency. Once the stone is located here it often passes into the bladder and the pain disappears completely. TREATMENT   Your caregiver will provide you with medicine for pain relief.  You may require specialized follow-up X-rays.  The absence of pain does not always mean that the stone has passed. It may have just stopped moving. If the urine remains completely obstructed, it can cause loss of kidney function or even complete destruction of the involved kidney. It is your responsibility and in your interest that X-rays and follow-ups as suggested by your caregiver are completed. Relief of pain without passage of the stone can be associated with severe damage to the kidney, including loss of kidney  function on that side.  If your stone does not pass on its own, additional measures may be taken by your caregiver to ensure its removal. HOME CARE INSTRUCTIONS   Increase your fluid intake. Water is the preferred fluid since juices containing vitamin C may acidify the urine making it less likely for certain stones (uric acid stones) to pass.  Strain all urine. A strainer will be provided. Keep all particulate matter or stones for your caregiver to inspect.  Take your pain medicine as directed.  Make a follow-up appointment with your caregiver as directed.  Remember that the goal is passage of your stone. The absence of pain does not mean the stone is gone. Follow your caregiver's instructions.  Only take over-the-counter or prescription medicines for pain, discomfort, or fever as directed by your caregiver. SEEK MEDICAL CARE IF:   Pain cannot be controlled with the prescribed medicine.  You have a fever.  Pain continues for longer than your caregiver advises it should.  There is a change in the pain, and you develop chest discomfort or constant abdominal pain.  You feel faint or pass out. MAKE SURE YOU:   Understand these instructions.  Will watch your condition.  Will get help right away if you are  not doing well or get worse. Document Released: 11/24/2004 Document Revised: 06/11/2012 Document Reviewed: 08/11/2010 [facility] Patient Information 2015 [place]. This information is not intended to replace advice given to you by your health care provider. Make sure you discuss any questions you have with your health care provider.

## 2013-10-12 NOTE — ED Notes (Signed)
Pt beginning to fall asleep after pain med administration, O2 levels in the mid 80's, placed on 2L of O2 with saturation level of 98%.

## 2013-10-12 NOTE — ED Provider Notes (Signed)
CSN: [id number]     Arrival date & time 10/12/13  0715 History   First MD Initiated Contact with Patient 10/12/13 [phone]     Chief Complaint  Patient presents with  . Abdominal Pain     (Consider location/radiation/quality/duration/timing/severity/associated sxs/prior Treatment) HPI  [name] is a 73 y.o. female who developed right lower abdominal pain, radiating to the right flank, at 5 AM this morning. This pain is recurrent. The pain has been persistent since it started. The pain is moderately severe. She has associated nausea, vomiting, and diarrhea. Emesis is clear color. The diarrhea is brown in color. She denies fever, chills, cough, shortness of breath, chest pain, weakness or dizziness. She had similar pain in May 2015 was evaluated and treated and discharged. She has not had similar pain, since then. She denies dysuria, urinary frequency, or hematuria. There are no other known modifying factors.   Past Medical History  Diagnosis Date  . Hypertension   . Diabetes mellitus   . TIA (transient ischemic attack)   . Hyperlipidemia   . Arthritis   . Kidney stone on right side 5/15  . Ureteral stricture, right 5/15   Past Surgical History  Procedure Laterality Date  . Cholecystectomy    . Abdominal hysterectomy     History reviewed. No pertinent family history. History  Substance Use Topics  . Smoking status: Never Smoker   . Smokeless tobacco: Not on file  . Alcohol Use: No   OB History   Grav Para Term Preterm Abortions TAB SAB Ect Mult Living                 Review of Systems  All other systems reviewed and are negative.     Allergies  Metformin and related  Home Medications   Prior to Admission medications   Medication Sig Start Date End Date Taking? Authorizing Provider  acetaminophen (TYLENOL) 500 MG tablet Take 1,000 mg by mouth every 6 (six) hours as needed for moderate pain.   Yes Historical Provider, MD  allopurinol (ZYLOPRIM) 300 MG tablet  Take 300 mg by mouth daily.   Yes Historical Provider, MD  aspirin EC 81 MG tablet Take 81 mg by mouth daily.   Yes Historical Provider, MD  colchicine 0.6 MG tablet Take 0.6 mg by mouth daily as needed (for gout).   Yes Historical Provider, MD  diclofenac sodium (VOLTAREN) 1 % GEL Apply 2 g topically 2 (two) times daily as needed (knee pain).   Yes Historical Provider, MD  esomeprazole (NEXIUM) 40 MG capsule Take 40 mg by mouth daily at 12 noon.   Yes Historical Provider, MD  glimepiride (AMARYL) 4 MG tablet Take 4 mg by mouth daily with breakfast.   Yes Historical Provider, MD  lisinopril-hydrochlorothiazide (PRINZIDE,ZESTORETIC) 20-25 MG per tablet Take 1 tablet by mouth daily.   Yes Historical Provider, MD  HYDROcodone-acetaminophen (NORCO) 5-325 MG per tablet Take 2 tablets by mouth every 4 (four) hours as needed. 10/12/13   Flint Melter, MD  tamsulosin (FLOMAX) 0.4 MG CAPS capsule 1 q HS to aid stone passage 10/12/13   Flint Melter, MD   BP 155/71  Pulse 91  Temp(Src) 97.8 F (36.6 C) (Oral)  Resp 16  Ht 5' 2.5" (1.588 m)  SpO2 100% Physical Exam  Nursing note and vitals reviewed. Constitutional: She is oriented to person, place, and time. She appears well-developed and well-nourished.  HENT:  Head: Normocephalic and atraumatic.  Eyes: Conjunctivae and EOM are  normal. Pupils are equal, round, and reactive to light.  Neck: Normal range of motion and phonation normal. Neck supple.  Cardiovascular: Normal rate, regular rhythm and intact distal pulses.   Pulmonary/Chest: Effort normal and breath sounds normal. She exhibits no tenderness.  Abdominal: Soft. She exhibits no distension and no mass. There is tenderness (Right lower and right mid-abdomen, mild). There is no rebound and no guarding.  Genitourinary:  No costovertebral angle tenderness, with percussion  Musculoskeletal: Normal range of motion.  Neurological: She is alert and oriented to person, place, and time. She  exhibits normal muscle tone.  Skin: Skin is warm and dry.  Psychiatric: She has a normal mood and affect. Her behavior is normal. Judgment and thought content normal.    ED Course  Procedures (including critical care time) Medications  0.9 %  sodium chloride infusion ( Intravenous New Bag/Given 10/12/13 0831)  fentaNYL (SUBLIMAZE) injection 100 mcg (100 mcg Intravenous Given 10/12/13 1049)  ondansetron (ZOFRAN) injection 4 mg (4 mg Intravenous Given 10/12/13 0831)  iohexol (OMNIPAQUE) 300 MG/ML solution 25 mL (25 mLs Oral Contrast Given 10/12/13 0813)  iohexol (OMNIPAQUE) 300 MG/ML solution 100 mL (100 mLs Intravenous Contrast Given 10/12/13 1020)    Patient Vitals for the past 24 hrs:  BP Temp Temp src Pulse Resp SpO2 Height  10/12/13 1225 155/71 mmHg - - - 16 100 % -  10/12/13 1215 172/71 mmHg - - 91 19 100 % -  10/12/13 1145 137/62 mmHg - - 86 13 100 % -  10/12/13 1115 143/60 mmHg - - 92 13 100 % -  10/12/13 1100 153/61 mmHg - - 89 12 99 % -  10/12/13 1000 156/66 mmHg - - 86 14 99 % -  10/12/13 0949 128/71 mmHg - - - 16 100 % -  10/12/13 0930 157/67 mmHg - - 81 16 96 % -  10/12/13 0900 157/64 mmHg - - 81 17 95 % -  10/12/13 0830 101/55 mmHg - - 87 19 99 % -  10/12/13 0800 119/50 mmHg - - 80 17 99 % -  10/12/13 0730 113/63 mmHg - - 83 - 100 % -  10/12/13 0724 159/62 mmHg 97.8 F (36.6 C) Oral 87 16 99 % 5' 2.5" (1.588 m)    8:01 AM Reevaluation with update and discussion. After initial assessment and treatment, an updated evaluation reveals she is comfortable now. She is not having pain, currently. Findings discussed with patient, questions answered. [name]   Labs Review Labs Reviewed  CBC WITH DIFFERENTIAL - Abnormal; Notable for the following:    Hemoglobin 10.6 (*)    HCT 33.4 (*)    MCH 25.8 (*)    All other components within normal limits  BASIC METABOLIC PANEL - Abnormal; Notable for the following:    Glucose, Bld 157 (*)    Creatinine, Ser 1.32 (*)    GFR  calc non Af Amer 39 (*)    GFR calc Af Amer 45 (*)    All other components within normal limits  URINALYSIS, ROUTINE W REFLEX MICROSCOPIC - Abnormal; Notable for the following:    APPearance TURBID (*)    Hgb urine dipstick LARGE (*)    Protein, ur 30 (*)    Leukocytes, UA TRACE (*)    All other components within normal limits  URINE MICROSCOPIC-ADD ON - Abnormal; Notable for the following:    Squamous Epithelial / LPF MANY (*)    Bacteria, UA FEW (*)    All other  components within normal limits  URINE CULTURE    Imaging Review Ct Abdomen Pelvis W Wo Contrast  10/12/2013   CLINICAL DATA:  Right flank pain to right lower quadrant pain.  EXAM: CT ABDOMEN AND PELVIS WITHOUT AND WITH CONTRAST  TECHNIQUE: Multidetector CT imaging of the abdomen and pelvis was performed following the standard protocol before and following the bolus administration of intravenous contrast.  CONTRAST:  OMNIPAQUE IOHEXOL 300 MG/ML  SOLN  COMPARISON:  04/04/2013  FINDINGS: Moderate right hydronephrosis with dilation of the right ureter. There is a 2-3 mm stone at the right ureteral vesicular junction. Multiple small densities are seen adjacent to the distal ureter that are likely phleboliths. These were present on the prior CT.  There are nonobstructing stones in the lower pole of the right kidney. No left intrarenal stones. No renal masses. No left renal collecting system dilation. Normal left ureter.  No bladder mass or stone.  Liver, spleen and pancreas are unremarkable. No bile duct dilation. Small fat containing mass projects within the left adrenal gland which may reflect adjacent fat or a small myelolipoma. It is stable. Adrenal glands otherwise unremarkable. Uterus is surgically absent. No pelvic masses. No pathologically enlarged lymph nodes. No ascites.  Bowel is unremarkable normal appendix.  Mild degenerative changes noted of the visualized spine.  IMPRESSION: 1. 2-3 mm stone at the right ureterovesicular  junction causes moderate hydroureteronephrosis. 2. Nonobstructing stones in the lower pole of the right kidney. 3. No other acute findings.   Electronically Signed   By: Amie Portland M.D.   On: 10/12/2013 10:50     EKG Interpretation None      MDM   Final diagnoses:  Ureteral stone with hydronephrosis  Urethral colic due to calculus    Ureteral colic, secondary to partially obstructing kidney stone, right ureter. CT imaging was done with IV and oral contrast, to rule out a distal urethral stricture. The stricture was found. No evidence for sepsis, metabolic instability, or large stone that will need manipulation, or lithotripsy  Nursing Notes Reviewed/ Care Coordinated Applicable Imaging Reviewed Interpretation of Laboratory Data incorporated into ED treatment  The patient appears reasonably screened and/or stabilized for discharge and I doubt any other medical condition or other [facility] requiring further screening, evaluation, or treatment in the ED at this time prior to discharge.  Plan: Home Medications- Norco, Flomax; Home Treatments- strain urine; return here if the recommended treatment, does not improve the symptoms; Recommended follow up- Urology 1 week    Flint Melter, MD 10/12/13 1232

## 2013-10-12 NOTE — ED Notes (Signed)
CT aware pt has finished contrast 

## 2013-10-12 NOTE — ED Notes (Signed)
Pt reports Rt sided Abd pain with N/V/D.

## 2013-10-13 LAB — URINE CULTURE

## 2014-02-03 ENCOUNTER — Encounter: Payer: Self-pay | Admitting: *Deleted

## 2014-02-03 ENCOUNTER — Encounter: Payer: Medicare Other | Attending: Internal Medicine | Admitting: *Deleted

## 2014-02-03 DIAGNOSIS — E119 Type 2 diabetes mellitus without complications: Secondary | ICD-10-CM | POA: Diagnosis not present

## 2014-02-03 DIAGNOSIS — Z713 Dietary counseling and surveillance: Secondary | ICD-10-CM | POA: Diagnosis not present

## 2014-02-03 NOTE — Patient Instructions (Signed)
Plan:  Start practicing identifying your foods by food group and remember that Starch, Fruit, Milk and sweets contain carbohydrate Each 15 grams of carbohydrate = 1 Carb Choice Include protein in moderation with your meals and snacks Consider a small snack before bed and see if that improves your fasting BG in the AM Consider reading food labels for Total Carbohydrate of foods Consider  increasing your activity level by doing some Arm Chair exercises during commercials daily as tolerated Consider checking BG when you break out in a sweat to determine if having a low BG reaction

## 2014-02-03 NOTE — Progress Notes (Signed)
  Medical Nutrition Therapy:  Appt start time: 0930 end time:  1100.  Assessment:  Primary concerns today: here for nutrition education for Diabetes. History of Diabetes for about 5 years, no previous diabetes education. Lives alone. She shops and prepares her own meals. She has a meter, and checks her BG 3 times a week with reported range of [phone] mg/dl in AM. Not every had any symptoms of hypoglycemia unless in the middle of the night.  Preferred Learning Style: Auditory  Learning Readiness:   Ready  MEDICATIONS: see list, diabetes meds are januvia and glimiperide   DIETARY INTAKE:  24-hr recall:  B ( AM): 2 pkgs flavored oatmeal OR toast OR eggs, grits OR meat biscuit, water Snk ( AM): cheese and crackers   L ( PM): sandwich, chips, water Snk ( PM): PNB sandwich OR  D (5 PM): hot meal with lean meat, starch, not always a vegetable, water Snk ( PM): no food after 6 PM Beverages: water  Usual physical activity: history of osteoporosis and is in wheel chair at home and for appointments.   Estimated energy needs: 1200 calories 135 g carbohydrates 90 g protein 33 g fat  Progress Towards Goal(s):  In progress.   Nutritional Diagnosis:  NB-1.1 Food and nutrition-related knowledge deficit As related to Diabetes control.  As evidenced by A1c of 7.0 %.    Intervention:  Nutrition counseling and diabetes education initiated. Discussed Carb Counting as method of portion control, reading food labels, and benefits of increased activity. Encouraged her to consider Arm Chair exercises during commercials to get started.  Plan:  Start practicing identifying your foods by food group and remember that Starch, Fruit, Milk and sweets contain carbohydrate Each 15 grams of carbohydrate = 1 Carb Choice Include protein in moderation with your meals and snacks Consider a small snack before bed and see if that improves your fasting BG in the AM Consider reading food labels for Total Carbohydrate  of foods Consider  increasing your activity level by doing some Arm Chair exercises during commercials daily as tolerated Consider checking BG when you break out in a sweat to determine if having a low BG reaction    Teaching Method Utilized: Visual Auditory Hands on  Handouts given during visit include: Living Well with Diabetes Carb Counting and Food Label handouts Meal Plan Card Arm Chair Exercise handout  Barriers to learning/adherence to lifestyle change: sedentary lifestyle and history of large portion sizes  Demonstrated degree of understanding via:  Teach Back   Monitoring/Evaluation:  Dietary intake, exercise, reading food labels, and body weight in 1 month(s).

## 2014-03-06 ENCOUNTER — Ambulatory Visit: Payer: Medicare Other | Admitting: *Deleted

## 2014-05-03 ENCOUNTER — Encounter (HOSPITAL_COMMUNITY): Payer: Self-pay | Admitting: Nurse Practitioner

## 2014-05-03 ENCOUNTER — Emergency Department (HOSPITAL_COMMUNITY)
Admission: EM | Admit: 2014-05-03 | Discharge: 2014-05-03 | Disposition: A | Discharge status: Home / Self Care | Payer: Medicare Other | Attending: Emergency Medicine | Admitting: Emergency Medicine

## 2014-05-03 ENCOUNTER — Emergency Department (HOSPITAL_COMMUNITY): Payer: Medicare Other

## 2014-05-03 DIAGNOSIS — R42 Dizziness and giddiness: Secondary | ICD-10-CM | POA: Diagnosis not present

## 2014-05-03 DIAGNOSIS — Z87442 Personal history of urinary calculi: Secondary | ICD-10-CM | POA: Diagnosis not present

## 2014-05-03 DIAGNOSIS — Z79899 Other long term (current) drug therapy: Secondary | ICD-10-CM | POA: Insufficient documentation

## 2014-05-03 DIAGNOSIS — Z3202 Encounter for pregnancy test, result negative: Secondary | ICD-10-CM | POA: Insufficient documentation

## 2014-05-03 DIAGNOSIS — E785 Hyperlipidemia, unspecified: Secondary | ICD-10-CM | POA: Diagnosis not present

## 2014-05-03 DIAGNOSIS — Z9049 Acquired absence of other specified parts of digestive tract: Secondary | ICD-10-CM | POA: Insufficient documentation

## 2014-05-03 DIAGNOSIS — R110 Nausea: Secondary | ICD-10-CM | POA: Insufficient documentation

## 2014-05-03 DIAGNOSIS — I10 Essential (primary) hypertension: Secondary | ICD-10-CM | POA: Diagnosis present

## 2014-05-03 DIAGNOSIS — E119 Type 2 diabetes mellitus without complications: Secondary | ICD-10-CM | POA: Diagnosis not present

## 2014-05-03 DIAGNOSIS — Z7982 Long term (current) use of aspirin: Secondary | ICD-10-CM | POA: Diagnosis not present

## 2014-05-03 DIAGNOSIS — Z8673 Personal history of transient ischemic attack (TIA), and cerebral infarction without residual deficits: Secondary | ICD-10-CM | POA: Insufficient documentation

## 2014-05-03 DIAGNOSIS — M1990 Unspecified osteoarthritis, unspecified site: Secondary | ICD-10-CM | POA: Insufficient documentation

## 2014-05-03 LAB — CBC
HCT: 37 % (ref 36.0–46.0)
Hemoglobin: 12.2 g/dL (ref 12.0–15.0)
MCH: 26.8 pg (ref 26.0–34.0)
MCHC: 33 g/dL (ref 30.0–36.0)
MCV: 81.1 fL (ref 78.0–100.0)
Platelets: 201 10*3/uL (ref 150–400)
RBC: 4.56 MIL/uL (ref 3.87–5.11)
RDW: 15.6 % — AB (ref 11.5–15.5)
WBC: 7.3 10*3/uL (ref 4.0–10.5)

## 2014-05-03 LAB — I-STAT TROPONIN, ED: Troponin i, poc: 0.01 ng/mL (ref 0.00–0.08)

## 2014-05-03 LAB — URINALYSIS, ROUTINE W REFLEX MICROSCOPIC
Bilirubin Urine: NEGATIVE
GLUCOSE, UA: NEGATIVE mg/dL
HGB URINE DIPSTICK: NEGATIVE
KETONES UR: NEGATIVE mg/dL
Leukocytes, UA: NEGATIVE
Nitrite: NEGATIVE
PH: 5 (ref 5.0–8.0)
PROTEIN: 30 mg/dL — AB
Specific Gravity, Urine: 1.016 (ref 1.005–1.030)
UROBILINOGEN UA: 0.2 mg/dL (ref 0.0–1.0)

## 2014-05-03 LAB — BASIC METABOLIC PANEL
Anion gap: 7 (ref 5–15)
BUN: 20 mg/dL (ref 6–23)
CALCIUM: 9.9 mg/dL (ref 8.4–10.5)
CO2: 34 mmol/L — AB (ref 19–32)
CREATININE: 1.49 mg/dL — AB (ref 0.50–1.10)
Chloride: 99 mmol/L (ref 96–112)
GFR calc Af Amer: 39 mL/min — ABNORMAL LOW (ref >90)
GFR, EST NON AFRICAN AMERICAN: 34 mL/min — AB (ref >90)
Glucose, Bld: 154 mg/dL — ABNORMAL HIGH (ref 70–99)
POTASSIUM: 3.3 mmol/L — AB (ref 3.5–5.1)
Sodium: 140 mmol/L (ref 135–145)

## 2014-05-03 LAB — URINE MICROSCOPIC-ADD ON

## 2014-05-03 MED ORDER — SODIUM CHLORIDE 0.9 % IV BOLUS (SEPSIS)
1000.0000 mL | INTRAVENOUS | Status: AC
  Administered 2014-05-03: 1000 mL via INTRAVENOUS

## 2014-05-03 MED ORDER — ONDANSETRON 4 MG PO TBDP: ORAL_TABLET | ORAL | Status: DC

## 2014-05-03 MED ORDER — METOCLOPRAMIDE HCL 5 MG/ML IJ SOLN
5.0000 mg | Freq: Once | INTRAMUSCULAR | Status: AC
  Administered 2014-05-03: 5 mg via INTRAVENOUS
  Filled 2014-05-03: qty 2

## 2014-05-03 MED ORDER — ACETAMINOPHEN 325 MG PO TABS
650.0000 mg | ORAL_TABLET | Freq: Once | ORAL | Status: AC
  Administered 2014-05-03: 650 mg via ORAL
  Filled 2014-05-03: qty 2

## 2014-05-03 NOTE — ED Notes (Signed)
She reports since Sunday her BP has been elevated and shes had N/V. She takes her BP medication daily as prescribed. She saw her PCP yesterday for the complaints but today she developed a headache and decided to come to ER. She is not sure about her doctors plan from yesterday. She is A&Ox4, breathing easily

## 2014-05-03 NOTE — ED Provider Notes (Signed)
CSN: [id number]     Arrival date & time 05/03/14  1602 History   First MD Initiated Contact with Patient 05/03/14 1821     Chief Complaint  Patient presents with  . Hypertension     (Consider location/radiation/quality/duration/timing/severity/associated sxs/prior Treatment) Patient is a 74 y.o. female presenting with hypertension and dizziness. The history is provided by the patient.  Hypertension This is a chronic problem. The current episode started more than 2 days ago. The problem occurs constantly. The problem has not changed since onset.Pertinent negatives include no chest pain, no abdominal pain, no headaches and no shortness of breath. Nothing aggravates the symptoms. Nothing relieves the symptoms. Treatments tried: normal bp meds. The treatment provided no relief.  Dizziness Quality:  Unable to specify Severity:  Mild Onset quality:  Sudden Duration:  6 days Timing:  Intermittent Progression:  Unchanged Chronicity:  New Context: standing up   Relieved by: rest. Worsened by:  Nothing Ineffective treatments:  None tried Associated symptoms: nausea   Associated symptoms: no chest pain, no diarrhea, no headaches, no shortness of breath and no vomiting     Past Medical History  Diagnosis Date  . Hypertension   . Diabetes mellitus   . TIA (transient ischemic attack)   . Hyperlipidemia   . Arthritis   . Kidney stone on right side 5/15  . Ureteral stricture, right 5/15   Past Surgical History  Procedure Laterality Date  . Cholecystectomy    . Abdominal hysterectomy     History reviewed. No pertinent family history. History  Substance Use Topics  . Smoking status: Never Smoker   . Smokeless tobacco: Not on file  . Alcohol Use: No   OB History    No data available     Review of Systems  Constitutional: Negative for fever and fatigue.  HENT: Negative for congestion and drooling.   Eyes: Negative for pain.  Respiratory: Negative for cough and shortness of  breath.   Cardiovascular: Negative for chest pain.  Gastrointestinal: Positive for nausea. Negative for vomiting, abdominal pain and diarrhea.  Genitourinary: Negative for dysuria and hematuria.  Musculoskeletal: Negative for back pain, gait problem and neck pain.  Skin: Negative for color change.  Neurological: Positive for dizziness. Negative for headaches.  Hematological: Negative for adenopathy.  Psychiatric/Behavioral: Negative for behavioral problems.  All other systems reviewed and are negative.     Allergies  Metformin and related  Home Medications   Prior to Admission medications   Medication Sig Start Date End Date Taking? Authorizing Provider  acetaminophen (TYLENOL) 500 MG tablet Take 1,000 mg by mouth every 6 (six) hours as needed for moderate pain.   Yes Historical Provider, MD  allopurinol (ZYLOPRIM) 300 MG tablet Take 100 mg by mouth daily.    Yes Historical Provider, MD  aspirin EC 81 MG tablet Take 81 mg by mouth daily.    Historical Provider, MD  colchicine 0.6 MG tablet Take 0.6 mg by mouth daily as needed (for gout).   Yes Historical Provider, MD  diclofenac sodium (VOLTAREN) 1 % GEL Apply 2 g topically 2 (two) times daily as needed (knee pain).   Yes Historical Provider, MD  ergocalciferol (VITAMIN D2) 50000 UNITS capsule Take 50,000 Units by mouth once a week.   Yes Historical Provider, MD  esomeprazole (NEXIUM) 40 MG capsule Take 40 mg by mouth daily at 12 noon.    Historical Provider, MD  glimepiride (AMARYL) 4 MG tablet Take 4 mg by mouth daily with breakfast.  Yes Historical Provider, MD  HYDROcodone-acetaminophen (NORCO) 5-325 MG per tablet Take 2 tablets by mouth every 4 (four) hours as needed. Patient not taking: Reported on 05/03/2014 10/12/13   Flint MelterElliott L Wentz, MD  lisinopril-hydrochlorothiazide (PRINZIDE,ZESTORETIC) 20-25 MG per tablet Take 1 tablet by mouth daily.   Yes Historical Provider, MD  simvastatin (ZOCOR) 10 MG tablet Take 10 mg by mouth daily.    Yes Historical Provider, MD  sitaGLIPtin (JANUVIA) 100 MG tablet Take 100 mg by mouth daily.    Historical Provider, MD  tamsulosin (FLOMAX) 0.4 MG CAPS capsule 1 q HS to aid stone passage Patient not taking: Reported on 05/03/2014 10/12/13   Flint MelterElliott L Wentz, MD   BP 186/81 mmHg  Pulse 100  Temp(Src) 98.7 F (37.1 C) (Oral)  Resp 18  SpO2 99% Physical Exam  Constitutional: She is oriented to person, place, and time. She appears well-developed and well-nourished.  HENT:  Head: Normocephalic.  Mouth/Throat: Oropharynx is clear and moist. No oropharyngeal exudate.  Eyes: Conjunctivae and EOM are normal. Pupils are equal, round, and reactive to light.  Neck: Normal range of motion. Neck supple.  Cardiovascular: Normal rate, regular rhythm, normal heart sounds and intact distal pulses.  Exam reveals no gallop and no friction rub.   No murmur heard. Pulmonary/Chest: Effort normal and breath sounds normal. No respiratory distress. She has no wheezes.  Abdominal: Soft. Bowel sounds are normal. There is no tenderness. There is no rebound and no guarding.  Musculoskeletal: Normal range of motion. She exhibits no edema or tenderness.  Neurological: She is alert and oriented to person, place, and time.  alert, oriented x3 speech: normal in context and clarity memory: intact grossly cranial nerves II-XII: intact motor strength: full proximally and distally no involuntary movements or tremors sensation: intact to light touch diffusely  cerebellar: finger-to-nose and heel-to-shin intact gait: ambulates at baseline with moderate assistance by me, her and her son both agree she is ambulating at baseline.  Skin: Skin is warm and dry.  Psychiatric: She has a normal mood and affect. Her behavior is normal.  Nursing note and vitals reviewed.   ED Course  Procedures (including critical care time) Labs Review Labs Reviewed  CBC - Abnormal; Notable for the following:    RDW 15.6 (*)    All other  components within normal limits  BASIC METABOLIC PANEL - Abnormal; Notable for the following:    Potassium 3.3 (*)    CO2 34 (*)    Glucose, Bld 154 (*)    Creatinine, Ser 1.49 (*)    GFR calc non Af Amer 34 (*)    GFR calc Af Amer 39 (*)    All other components within normal limits  URINALYSIS, ROUTINE W REFLEX MICROSCOPIC - Abnormal; Notable for the following:    Protein, ur 30 (*)    All other components within normal limits  URINE MICROSCOPIC-ADD ON - Abnormal; Notable for the following:    Bacteria, UA FEW (*)    All other components within normal limits  I-STAT TROPOININ, ED    Imaging Review Dg Chest 2 View  05/03/2014   CLINICAL DATA:  74 year old female with dizziness for 1 week.  EXAM: CHEST  2 VIEW  COMPARISON:  01/08/2013 and prior chest radiographs  FINDINGS: The cardiomediastinal silhouette is unremarkable.  Mild peribronchial thickening is unchanged.  There is no evidence of focal airspace disease, pulmonary edema, suspicious pulmonary nodule/mass, pleural effusion, or pneumothorax. No acute bony abnormalities are identified.  IMPRESSION: No  active cardiopulmonary disease.   Electronically Signed   By: Harmon Pier M.D.   On: 05/03/2014 20:49   Ct Head Wo Contrast  05/03/2014   CLINICAL DATA:  Dizziness.  Hypertension  EXAM: CT HEAD WITHOUT CONTRAST  TECHNIQUE: Contiguous axial images were obtained from the base of the skull through the vertex without intravenous contrast.  COMPARISON:  10/12/2010  FINDINGS: Skull and Sinuses:Negative for fracture or destructive process. No acute sinusitis.  Orbits: No acute abnormality.  Brain: No evidence of acute infarction, hemorrhage, hydrocephalus, or mass lesion/mass effect. There is generalized brain atrophy which has progressed from 2012. Chronic small vessel disease noted on 2012 brain MRI is underestimated by CT.  IMPRESSION: 1. No acute intracranial disease. 2. Generalized brain atrophy which has mildly progressed from 2012.    Electronically Signed   By: Marnee Spring M.D.   On: 05/03/2014 21:28     EKG Interpretation   Date/Time:  Saturday May 03 2014 19:47:09 EST Ventricular Rate:  80 PR Interval:  154 QRS Duration: 77 QT Interval:  379 QTC Calculation: 437 R Axis:   67 Text Interpretation:  Sinus rhythm Nonspecific T abnormalities, lateral  leads Confirmed by Ebon Ketchum  MD, Hasana Alcorta ([id number]) on 05/03/2014 8:05:40 PM      MDM   Final diagnoses:  Dizziness    6:59 PM 74 y.o. female w hx of HTN, DM, TIA, who presents with dizziness and high blood pressure. She states that 6 days ago (sun night) she awoke with dizziness and nausea. She states she has had intermittent dizziness throughout the week, mostly with changing positions. She uses a walker to ambulate at baseline due to osteoarthritis. She also developed a gradual onset bitemporal squeezing headache earlier today which has persisted, currently 5 out of 10. She states that she had some vomiting the night that the dizziness began but has had only nausea since then. She states that her blood pressure has been high throughout the week and today it was systolic 200. She is afebrile and vital signs are unremarkable here. We'll get screening labs and imaging.  10:58 PM: I interpreted/reviewed the labs and/or imaging which were non-contributory. HA resolved.  VS c/w orthostatic HR. Pt asx on exam and feeling better. Doubt CVA. I have discussed the diagnosis/risks/treatment options with the patient and family and believe the pt to be eligible for discharge home to follow-up with her pcp next week. We also discussed returning to the ED immediately if new or worsening sx occur. We discussed the sx which are most concerning (e.g., worsening dizziness, weakness, numbness) that necessitate immediate return. Medications administered to the patient during their visit and any new prescriptions provided to the patient are listed below.  Medications given during this  visit Medications  sodium chloride 0.9 % bolus 1,000 mL (0 mLs Intravenous Stopped 05/03/14 2220)  metoCLOPramide (REGLAN) injection 5 mg (5 mg Intravenous Given 05/03/14 1940)  acetaminophen (TYLENOL) tablet 650 mg (650 mg Oral Given 05/03/14 1940)    New Prescriptions   ONDANSETRON (ZOFRAN ODT) 4 MG DISINTEGRATING TABLET     ODT q4 hours prn nausea/vomit       Purvis Sheffield, MD 05/04/14 0022

## 2014-05-03 NOTE — ED Notes (Signed)
Pt left with all belongings. 

## 2014-05-03 NOTE — Discharge Instructions (Signed)

## 2014-07-03 ENCOUNTER — Encounter (HOSPITAL_COMMUNITY): Payer: Self-pay | Admitting: Emergency Medicine

## 2014-07-03 ENCOUNTER — Emergency Department (HOSPITAL_COMMUNITY): Payer: Medicare Other

## 2014-07-03 ENCOUNTER — Emergency Department (HOSPITAL_COMMUNITY)
Admission: EM | Admit: 2014-07-03 | Discharge: 2014-07-03 | Disposition: A | Discharge status: Home / Self Care | Payer: Medicare Other | Attending: Emergency Medicine | Admitting: Emergency Medicine

## 2014-07-03 DIAGNOSIS — Z87448 Personal history of other diseases of urinary system: Secondary | ICD-10-CM | POA: Insufficient documentation

## 2014-07-03 DIAGNOSIS — M25552 Pain in left hip: Secondary | ICD-10-CM | POA: Diagnosis not present

## 2014-07-03 DIAGNOSIS — Z79899 Other long term (current) drug therapy: Secondary | ICD-10-CM | POA: Diagnosis not present

## 2014-07-03 DIAGNOSIS — E785 Hyperlipidemia, unspecified: Secondary | ICD-10-CM | POA: Diagnosis not present

## 2014-07-03 DIAGNOSIS — M1990 Unspecified osteoarthritis, unspecified site: Secondary | ICD-10-CM | POA: Diagnosis not present

## 2014-07-03 DIAGNOSIS — Z87442 Personal history of urinary calculi: Secondary | ICD-10-CM | POA: Diagnosis not present

## 2014-07-03 DIAGNOSIS — I10 Essential (primary) hypertension: Secondary | ICD-10-CM | POA: Diagnosis not present

## 2014-07-03 DIAGNOSIS — R110 Nausea: Secondary | ICD-10-CM | POA: Diagnosis not present

## 2014-07-03 DIAGNOSIS — E119 Type 2 diabetes mellitus without complications: Secondary | ICD-10-CM | POA: Diagnosis not present

## 2014-07-03 DIAGNOSIS — Z8673 Personal history of transient ischemic attack (TIA), and cerebral infarction without residual deficits: Secondary | ICD-10-CM | POA: Diagnosis not present

## 2014-07-03 DIAGNOSIS — Z7982 Long term (current) use of aspirin: Secondary | ICD-10-CM | POA: Diagnosis not present

## 2014-07-03 LAB — URINE MICROSCOPIC-ADD ON

## 2014-07-03 LAB — URINALYSIS, ROUTINE W REFLEX MICROSCOPIC
Bilirubin Urine: NEGATIVE
GLUCOSE, UA: NEGATIVE mg/dL
Hgb urine dipstick: NEGATIVE
KETONES UR: NEGATIVE mg/dL
LEUKOCYTES UA: NEGATIVE
NITRITE: NEGATIVE
PH: 5 (ref 5.0–8.0)
Protein, ur: 30 mg/dL — AB
SPECIFIC GRAVITY, URINE: 1.02 (ref 1.005–1.030)
Urobilinogen, UA: 0.2 mg/dL (ref 0.0–1.0)

## 2014-07-03 MED ORDER — CYCLOBENZAPRINE HCL 10 MG PO TABS
5.0000 mg | ORAL_TABLET | Freq: Once | ORAL | Status: AC
  Administered 2014-07-03: 5 mg via ORAL
  Filled 2014-07-03: qty 1

## 2014-07-03 MED ORDER — CYCLOBENZAPRINE HCL 10 MG PO TABS: 10.0000 mg | ORAL_TABLET | Freq: Three times a day (TID) | ORAL | Status: AC | PRN

## 2014-07-03 MED ORDER — FENTANYL CITRATE (PF) 100 MCG/2ML IJ SOLN
50.0000 ug | Freq: Once | INTRAMUSCULAR | Status: AC
  Administered 2014-07-03: 50 ug via INTRAVENOUS
  Filled 2014-07-03: qty 2

## 2014-07-03 MED ORDER — NAPROXEN 500 MG PO TABS: 500.0000 mg | ORAL_TABLET | Freq: Two times a day (BID) | ORAL | Status: AC | PRN

## 2014-07-03 MED ORDER — HYDROCODONE-ACETAMINOPHEN 5-325 MG PO TABS
2.0000 | ORAL_TABLET | Freq: Once | ORAL | Status: AC
  Administered 2014-07-03: 2 via ORAL
  Filled 2014-07-03: qty 2

## 2014-07-03 MED ORDER — HYDROCODONE-ACETAMINOPHEN 5-325 MG PO TABS: 1.0000 | ORAL_TABLET | Freq: Four times a day (QID) | ORAL | Status: DC | PRN

## 2014-07-03 MED ORDER — FENTANYL CITRATE (PF) 100 MCG/2ML IJ SOLN
50.0000 ug | Freq: Once | INTRAMUSCULAR | Status: AC
  Administered 2014-07-03: 50 ug via INTRAVENOUS

## 2014-07-03 MED ORDER — FENTANYL CITRATE (PF) 100 MCG/2ML IJ SOLN
INTRAMUSCULAR | Status: AC
  Filled 2014-07-03: qty 2

## 2014-07-03 NOTE — ED Notes (Signed)
Pt attempted to ambulate, unable to sit up d/t pain. Pa mercedes aware

## 2014-07-03 NOTE — ED Provider Notes (Signed)
CSN: [id number]     Arrival date & time    History   First MD Initiated Contact with Patient 07/03/14 1906     Chief Complaint  Patient presents with  . Hip Pain     (Consider location/radiation/quality/duration/timing/severity/associated sxs/prior Treatment) HPI Comments: [name] is a 74 y.o. female with a PMHx of HTN, DM2, TIA, HLD, gout, arthritis, nephrolithiasis, and R ureteral stricture, who presents to the ED with complaints of gradual onset left hip pain 2 days. She states that the pain is 7/10 throbbing, intermittent only coming with movement, located to the lateral aspect of the L hip, nonradiating, worse with movement, and relieved with fentanyl given in route by EMS. She reports that ibuprofen and Tylenol provided no relief. She states that prior to EMS arrival she felt somewhat nauseated, but this is proved after receiving Zofran. She reports that she is not been taking her blood pressure medications for the last 2 days or her gout medications because she was concerned that they might interfere with the ibuprofen that she was taking for her pain. Of note, she states she takes colcrys BID and allopurinol daily, although this is not what is indicated in her chart. She denies any fevers, chills, chest pain, shortness breath, URI symptoms, abdominal pain, vomiting, diarrhea, constipation, dysuria, hematuria, flank or back pain, neck pain, numbness, tingling, weakness, warmth, erythema, or any injury to that. Denies any falls. Does not ambulate at home due to chronic knee pain, stating that she uses a wheelchair and will transfer by standing and pivoting, but that is the extent of her ambulatory abilities. States she's never had gout in her hips, only in her feet. She lives at home alone.  Patient is a 74 y.o. female presenting with hip pain. The history is provided by the patient. No language interpreter was used.  Hip Pain This is a new problem. The current episode started  yesterday. The problem occurs constantly. The problem has been unchanged. Associated symptoms include arthralgias (L hip) and nausea (mildly earlier, now resolved). Pertinent negatives include no abdominal pain, chest pain, chills, fever, joint swelling, myalgias, neck pain, numbness, sore throat, urinary symptoms, vomiting or weakness. Exacerbated by: movement. She has tried acetaminophen and NSAIDs (and fentanyl) for the symptoms. The treatment provided mild relief.    Past Medical History  Diagnosis Date  . Hypertension   . Diabetes mellitus   . TIA (transient ischemic attack)   . Hyperlipidemia   . Arthritis   . Kidney stone on right side 5/15  . Ureteral stricture, right 5/15   Past Surgical History  Procedure Laterality Date  . Cholecystectomy    . Abdominal hysterectomy     No family history on file. History  Substance Use Topics  . Smoking status: Never Smoker   . Smokeless tobacco: Not on file  . Alcohol Use: No   OB History    No data available     Review of Systems  Constitutional: Negative for fever and chills.  HENT: Negative for rhinorrhea and sore throat.   Respiratory: Negative for shortness of breath.   Cardiovascular: Negative for chest pain.  Gastrointestinal: Positive for nausea (mildly earlier, now resolved). Negative for vomiting, abdominal pain, diarrhea and constipation.  Genitourinary: Negative for dysuria, hematuria and flank pain.  Musculoskeletal: Positive for arthralgias (L hip). Negative for myalgias, back pain, joint swelling and neck pain.  Skin: Negative for color change.  Allergic/Immunologic: Positive for immunocompromised state (diabetic).  Neurological: Negative for weakness  and numbness.  Psychiatric/Behavioral: Negative for confusion.   10 Systems reviewed and are negative for acute change except as noted in the HPI.    Allergies  Metformin and related  Home Medications   Prior to Admission medications   Medication Sig Start  Date End Date Taking? Authorizing Provider  acetaminophen (TYLENOL) 500 MG tablet Take 1,000 mg by mouth every 6 (six) hours as needed for moderate pain.    Historical Provider, MD  allopurinol (ZYLOPRIM) 300 MG tablet Take 100 mg by mouth daily.     Historical Provider, MD  aspirin EC 81 MG tablet Take 81 mg by mouth daily.    Historical Provider, MD  colchicine 0.6 MG tablet Take 0.6 mg by mouth daily as needed (for gout).    Historical Provider, MD  diclofenac sodium (VOLTAREN) 1 % GEL Apply 2 g topically 2 (two) times daily as needed (knee pain).    Historical Provider, MD  ergocalciferol (VITAMIN D2) 50000 UNITS capsule Take 50,000 Units by mouth once a week.    Historical Provider, MD  esomeprazole (NEXIUM) 40 MG capsule Take 40 mg by mouth daily at 12 noon.    Historical Provider, MD  glimepiride (AMARYL) 4 MG tablet Take 4 mg by mouth daily with breakfast.    Historical Provider, MD  HYDROcodone-acetaminophen (NORCO) 5-325 MG per tablet Take 2 tablets by mouth every 4 (four) hours as needed. Patient not taking: Reported on 05/03/2014 10/12/13   Mancel Bale, MD  lisinopril-hydrochlorothiazide (PRINZIDE,ZESTORETIC) 20-25 MG per tablet Take 1 tablet by mouth daily.    Historical Provider, MD  ondansetron (ZOFRAN ODT) 4 MG disintegrating tablet  ODT q4 hours prn nausea/vomit 05/03/14   Purvis Sheffield, MD  simvastatin (ZOCOR) 10 MG tablet Take 10 mg by mouth daily.    Historical Provider, MD  sitaGLIPtin (JANUVIA) 100 MG tablet Take 100 mg by mouth daily.    Historical Provider, MD  tamsulosin (FLOMAX) 0.4 MG CAPS capsule 1 q HS to aid stone passage Patient not taking: Reported on 05/03/2014 10/12/13   Mancel Bale, MD   BP 151/66 mmHg  Pulse 104  Temp(Src) 98.2 F (36.8 C) (Oral)  Resp 16  SpO2 98% Physical Exam  Constitutional: She is oriented to person, place, and time. Vital signs are normal. She appears well-developed and well-nourished.  Non-toxic appearance. No distress.    Afebrile, nontoxic, NAD  HENT:  Head: Normocephalic and atraumatic.  Mouth/Throat: Oropharynx is clear and moist and mucous membranes are normal.  Eyes: Conjunctivae and EOM are normal. Right eye exhibits no discharge. Left eye exhibits no discharge.  Neck: Normal range of motion. Neck supple.  Cardiovascular: Normal rate, regular rhythm, normal heart sounds and intact distal pulses.  Exam reveals no gallop and no friction rub.   No murmur heard. Pulmonary/Chest: Effort normal and breath sounds normal. No respiratory distress. She has no decreased breath sounds. She has no wheezes. She has no rhonchi. She has no rales.  Abdominal: Soft. Normal appearance and bowel sounds are normal. She exhibits no distension. There is no tenderness. There is no rigidity, no rebound, no guarding, no CVA tenderness, no tenderness at McBurney's point and negative Murphy's sign.  Musculoskeletal:       Left hip: She exhibits decreased range of motion (mildly diminished ROM due to pain, able to flex ~60% and fully extend) and tenderness. She exhibits normal strength, no swelling, no crepitus and no deformity.       Legs: L hip with TTP over lateral  jointline and greater trochanteric bursa, with slightly diminished ROM due to pain but still able to flex to ~60% and fully extend. No swelling or deformity, no leg shortening or abnormal rotation, no crepitus, no erythema or warmth. Neg log roll test. Strength and sensation grossly intact. Distal pulses intact.   Neurological: She is alert and oriented to person, place, and time. She has normal strength. No sensory deficit.  Skin: Skin is warm, dry and intact. No rash noted. No erythema.  No erythema or warmth of L hip  Psychiatric: She has a normal mood and affect.  Nursing note and vitals reviewed.   ED Course  Procedures (including critical care time) Labs Review Labs Reviewed  URINALYSIS, ROUTINE W REFLEX MICROSCOPIC - Abnormal; Notable for the following:     Protein, ur 30 (*)    All other components within normal limits  URINE MICROSCOPIC-ADD ON - Abnormal; Notable for the following:    Bacteria, UA FEW (*)    Casts HYALINE CASTS (*)    All other components within normal limits  URINE CULTURE    Imaging Review Dg Hip Unilat With Pelvis 2-3 Views Left  07/03/2014   CLINICAL DATA:  Left hip pain, no known injury or fall  EXAM: LEFT HIP (WITH PELVIS) 2-3 VIEWS  COMPARISON:  CT abdomen 08/13/2011  FINDINGS: There is no evidence of hip fracture or dislocation. There is no evidence of arthropathy or other focal bone abnormality. There is peripheral vascular atherosclerotic disease. There is bilateral sacroiliitis likely degenerative unchanged compared with 08/13/2011.  IMPRESSION: No acute osseous injury of the hips. Given the patient's age and osteopenia, if there is persistent clinical concern for a hip fracture, a MRI of the hip is recommended for increased sensitivity.   Electronically Signed   By: Elige KoHetal  Patel   On: 07/03/2014 19:03     EKG Interpretation None      MDM   Final diagnoses:  Left hip pain    74 y.o. female here with atraumatic L hip pain x2 days. Has hx of gout but states she takes allopurinol and colcrys daily, which is odd since the colcrys should be PRN not scheduled. Neurovascularly intact, soft compartments. ROM of L hip slightly limited due to pain, but able to flex to approx 70%, full extension without pain. Pt nonambulatory at baseline, but when attempted to stand, couldn't roll over due to pain. Tenderness on exam is at jointline and over greater trochanteric bursa. No erythema or warmth. Afebrile. Xray without acute findings, although does state that if hip fx is a concern to obtain MRI. No trauma or etiology for fracture, doubt this is needed. Will give pain meds now and reattempt standing.   9:07 PM Pt continued to complain of pain, given more fentanyl a short time ago, states it's improving but she continues to  refuse to stand due to the pain. She is able to lift her leg up to ~60% of hip flexion on her own power for my exam, but when nursing tries to get her to sit in bed, she screams in pain and won't do it. Dr. Loretha StaplerWofford will assess pt and we will attempt to stand pt up shortly.  9:50 PM Pt evaluated by Dr. Loretha StaplerWofford. On his exam, pt allowed for more passive ROM than previously, he feels this is more musculoskeletal than anything. Will give flexeril and norco and get U/A then d/c home after U/A results  10:44 PM U/A resulting with few bacteria, 3-6 WBC, will  send for culture but doubt need for treatment. Pt feels improved with flexeril and norco. Will d/c home with these and NSAIDs. Will have her f/up with PCP in 1wk. I explained the diagnosis and have given explicit precautions to return to the ER including for any other new or worsening symptoms. The patient understands and accepts the medical plan as it's been dictated and I have answered their questions. Discharge instructions concerning home care and prescriptions have been given. The patient is STABLE and is discharged to home in good condition.  BP 117/60 mmHg  Pulse 94  Temp(Src) 98.2 F (36.8 C) (Oral)  Resp 16  SpO2 99%  Meds ordered this encounter  Medications  . fentaNYL (SUBLIMAZE) injection 50 mcg    Sig:   . fentaNYL (SUBLIMAZE) injection 50 mcg    Sig:   . fentaNYL (SUBLIMAZE) 100 MCG/2ML injection    Sig:     [name], [name]   : cabinet override  . cyclobenzaprine (FLEXERIL) tablet 5 mg    Sig:   . HYDROcodone-acetaminophen (NORCO/VICODIN) 5-325 MG per tablet 2 tablet    Sig:   . cyclobenzaprine (FLEXERIL) 10 MG tablet    Sig: Take 1 tablet (10 mg total) by mouth 3 (three) times daily as needed for muscle spasms.    Dispense:  15 tablet    Refill:  0    Order Specific Question:  Supervising Provider    Answer:  MILLER, BRIAN [[id number]]  . HYDROcodone-acetaminophen (NORCO) 5-325 MG per tablet    Sig: Take 1-2 tablets by mouth  every 6 (six) hours as needed for severe pain.    Dispense:  10 tablet    Refill:  0    Order Specific Question:  Supervising Provider    Answer:  Hyacinth MeekerMILLER, BRIAN [[id number]]  . naproxen (NAPROSYN) 500 MG tablet    Sig: Take 1 tablet (500 mg total) by mouth 2 (two) times daily as needed for mild pain, moderate pain or headache (TAKE WITH MEALS.).    Dispense:  20 tablet    Refill:  0    Order Specific Question:  Supervising Provider    Answer:  Eber HongMILLER, BRIAN [[id number]]     Sophia Brevik Camprubi-Soms, PA-C 07/03/14 [id number]  Blake DivineJohn Wofford, MD 07/05/14 [phone]

## 2014-07-03 NOTE — ED Notes (Signed)
Per [facility], pt from home, c/o L hip pain for two days. No injury. Per ems, no bruising, shortening, rotation, or swelling noted to L hip. Pt given 100mcg fentanyl and 4 zofran by EMS. Pt is AAOX4, in NAD. BP 220/160 [facility] Pt able to move toes, pulses palpable in left foot.

## 2014-07-03 NOTE — ED Notes (Signed)
Went into room, pt tearful, crying, c/o pain.

## 2014-07-05 LAB — URINE CULTURE: SPECIAL REQUESTS: NORMAL

## 2014-07-07 ENCOUNTER — Encounter (HOSPITAL_COMMUNITY): Payer: Self-pay | Admitting: Emergency Medicine

## 2014-07-07 ENCOUNTER — Emergency Department (HOSPITAL_COMMUNITY): Payer: Medicare Other

## 2014-07-07 ENCOUNTER — Emergency Department (HOSPITAL_COMMUNITY)
Admission: EM | Admit: 2014-07-07 | Discharge: 2014-07-07 | Disposition: A | Discharge status: Home / Self Care | Payer: Medicare Other | Attending: Emergency Medicine | Admitting: Emergency Medicine

## 2014-07-07 DIAGNOSIS — N200 Calculus of kidney: Secondary | ICD-10-CM | POA: Diagnosis not present

## 2014-07-07 DIAGNOSIS — E785 Hyperlipidemia, unspecified: Secondary | ICD-10-CM | POA: Insufficient documentation

## 2014-07-07 DIAGNOSIS — R52 Pain, unspecified: Secondary | ICD-10-CM

## 2014-07-07 DIAGNOSIS — Z87442 Personal history of urinary calculi: Secondary | ICD-10-CM | POA: Diagnosis not present

## 2014-07-07 DIAGNOSIS — M47817 Spondylosis without myelopathy or radiculopathy, lumbosacral region: Secondary | ICD-10-CM

## 2014-07-07 DIAGNOSIS — I878 Other specified disorders of veins: Secondary | ICD-10-CM | POA: Diagnosis not present

## 2014-07-07 DIAGNOSIS — M545 Low back pain: Secondary | ICD-10-CM | POA: Diagnosis present

## 2014-07-07 DIAGNOSIS — I10 Essential (primary) hypertension: Secondary | ICD-10-CM | POA: Insufficient documentation

## 2014-07-07 DIAGNOSIS — Z8673 Personal history of transient ischemic attack (TIA), and cerebral infarction without residual deficits: Secondary | ICD-10-CM | POA: Insufficient documentation

## 2014-07-07 DIAGNOSIS — M5431 Sciatica, right side: Secondary | ICD-10-CM | POA: Insufficient documentation

## 2014-07-07 DIAGNOSIS — Z79899 Other long term (current) drug therapy: Secondary | ICD-10-CM | POA: Diagnosis not present

## 2014-07-07 DIAGNOSIS — M1990 Unspecified osteoarthritis, unspecified site: Secondary | ICD-10-CM | POA: Insufficient documentation

## 2014-07-07 DIAGNOSIS — Z7982 Long term (current) use of aspirin: Secondary | ICD-10-CM | POA: Insufficient documentation

## 2014-07-07 DIAGNOSIS — E119 Type 2 diabetes mellitus without complications: Secondary | ICD-10-CM | POA: Diagnosis not present

## 2014-07-07 LAB — URINALYSIS, ROUTINE W REFLEX MICROSCOPIC
Bilirubin Urine: NEGATIVE
Glucose, UA: NEGATIVE mg/dL
Hgb urine dipstick: NEGATIVE
Ketones, ur: 15 mg/dL — AB
Leukocytes, UA: NEGATIVE
Nitrite: NEGATIVE
Protein, ur: 30 mg/dL — AB
Specific Gravity, Urine: 1.019 (ref 1.005–1.030)
Urobilinogen, UA: 0.2 mg/dL (ref 0.0–1.0)
pH: 5 (ref 5.0–8.0)

## 2014-07-07 LAB — BASIC METABOLIC PANEL
Anion gap: 8 (ref 5–15)
BUN: 26 mg/dL — ABNORMAL HIGH (ref 6–20)
CO2: 28 mmol/L (ref 22–32)
Calcium: 9.5 mg/dL (ref 8.9–10.3)
Chloride: 105 mmol/L (ref 101–111)
Creatinine, Ser: 1.45 mg/dL — ABNORMAL HIGH (ref 0.44–1.00)
GFR calc Af Amer: 40 mL/min — ABNORMAL LOW (ref >60)
GFR calc non Af Amer: 35 mL/min — ABNORMAL LOW (ref >60)
Glucose, Bld: 167 mg/dL — ABNORMAL HIGH (ref 70–99)
Potassium: 3.9 mmol/L (ref 3.5–5.1)
Sodium: 141 mmol/L (ref 135–145)

## 2014-07-07 LAB — URINE MICROSCOPIC-ADD ON

## 2014-07-07 LAB — CBC WITH DIFFERENTIAL/PLATELET
Basophils Absolute: 0 10*3/uL (ref 0.0–0.1)
Basophils Relative: 0 % (ref 0–1)
Eosinophils Absolute: 0 10*3/uL (ref 0.0–0.7)
Eosinophils Relative: 0 % (ref 0–5)
HCT: 36.3 % (ref 36.0–46.0)
Hemoglobin: 11.6 g/dL — ABNORMAL LOW (ref 12.0–15.0)
Lymphocytes Relative: 8 % — ABNORMAL LOW (ref 12–46)
Lymphs Abs: 0.9 10*3/uL (ref 0.7–4.0)
MCH: 26.7 pg (ref 26.0–34.0)
MCHC: 32 g/dL (ref 30.0–36.0)
MCV: 83.6 fL (ref 78.0–100.0)
Monocytes Absolute: 0.6 10*3/uL (ref 0.1–1.0)
Monocytes Relative: 5 % (ref 3–12)
Neutro Abs: 9.7 10*3/uL — ABNORMAL HIGH (ref 1.7–7.7)
Neutrophils Relative %: 87 % — ABNORMAL HIGH (ref 43–77)
Platelets: 217 10*3/uL (ref 150–400)
RBC: 4.34 MIL/uL (ref 3.87–5.11)
RDW: 14.2 % (ref 11.5–15.5)
WBC: 11.3 10*3/uL — ABNORMAL HIGH (ref 4.0–10.5)

## 2014-07-07 MED ORDER — PREDNISONE 10 MG PO TABS: 20.0000 mg | ORAL_TABLET | Freq: Every day | ORAL | Status: AC

## 2014-07-07 MED ORDER — MORPHINE SULFATE 4 MG/ML IJ SOLN
4.0000 mg | Freq: Once | INTRAMUSCULAR | Status: AC
  Administered 2014-07-07: 4 mg via INTRAMUSCULAR
  Filled 2014-07-07: qty 1

## 2014-07-07 MED ORDER — DIAZEPAM 5 MG PO TABS
5.0000 mg | ORAL_TABLET | Freq: Once | ORAL | Status: AC
  Administered 2014-07-07: 5 mg via ORAL
  Filled 2014-07-07: qty 1

## 2014-07-07 MED ORDER — OXYCODONE-ACETAMINOPHEN 5-325 MG PO TABS
2.0000 | ORAL_TABLET | Freq: Once | ORAL | Status: AC
  Administered 2014-07-07: 2 via ORAL
  Filled 2014-07-07: qty 2

## 2014-07-07 MED ORDER — DEXAMETHASONE SODIUM PHOSPHATE 10 MG/ML IJ SOLN
10.0000 mg | Freq: Once | INTRAMUSCULAR | Status: AC
  Administered 2014-07-07: 10 mg via INTRAMUSCULAR
  Filled 2014-07-07: qty 1

## 2014-07-07 MED ORDER — IBUPROFEN 200 MG PO TABS
400.0000 mg | ORAL_TABLET | Freq: Once | ORAL | Status: AC
  Administered 2014-07-07: 400 mg via ORAL
  Filled 2014-07-07: qty 2

## 2014-07-07 NOTE — ED Notes (Signed)
While attempting to d/c pt., pt transferred to wc and was in a severe pain. MD aware and order more medication for pt and ordered not to d/c now.

## 2014-07-07 NOTE — ED Notes (Signed)
Awake. Verbally responsive. A/O x4. Resp even and unlabored. No audible adventitious breath sounds noted. ABC's intact.  

## 2014-07-07 NOTE — ED Notes (Signed)
Pt arrived via EMS with c/o rt lower back pain without radiation. Denies trauma/injury. Pt started in lt hip and moved across back over 5 days. [facility] and dx with gout/muscle spasms. Pain medication is not helping and getting worse. Given Fentanyl 100mcg IV via EMS enroute. Pt denies dysuria and lower abd pressure/burning with void. Pt reported having urinary frequency/urgency and foul odor urine.

## 2014-07-07 NOTE — ED Notes (Signed)
Bed: [id number] Expected date:  Expected time:  Means of arrival:  Comments: EMS- hip pain, IV established

## 2014-07-07 NOTE — ED Provider Notes (Signed)
CSN: [id number]     Arrival date & time 07/07/14  1308 History   First MD Initiated Contact with Patient 07/07/14 1349     Chief Complaint  Patient presents with  . Back Pain     (Consider location/radiation/quality/duration/timing/severity/associated sxs/prior Treatment) HPI The patient's hip and lower back pain that she had been seen for previously has now migrated around and she is expressing more pain towards her right SI joint. She is very limited in her mobility and doesn't move around much because of ongoing chronic pain in her legs. She essentially stays in her bed and does minimal transferring in her home using a wheelchair for her basic needs. There is been no fever, no chills, no abdominal pain, no urinary symptoms. The pain does not radiate into her legs. Past Medical History  Diagnosis Date  . Hypertension   . Diabetes mellitus   . TIA (transient ischemic attack)   . Hyperlipidemia   . Arthritis   . Kidney stone on right side 5/15  . Ureteral stricture, right 5/15   Past Surgical History  Procedure Laterality Date  . Cholecystectomy    . Abdominal hysterectomy     History reviewed. No pertinent family history. History  Substance Use Topics  . Smoking status: Never Smoker   . Smokeless tobacco: Not on file  . Alcohol Use: No   OB History    No data available     Review of Systems  10 Systems reviewed and are negative for acute change except as noted in the HPI.   Allergies  Metformin and related  Home Medications   Prior to Admission medications   Medication Sig Start Date End Date Taking? Authorizing Provider  acetaminophen (TYLENOL) 500 MG tablet Take 1,000 mg by mouth every 6 (six) hours as needed for moderate pain.   Yes Historical Provider, MD  allopurinol (ZYLOPRIM) 100 MG tablet Take 100 mg by mouth daily. 06/18/14  Yes Historical Provider, MD  aspirin EC 81 MG tablet Take 81 mg by mouth daily.   Yes Historical Provider, MD  colchicine 0.6 MG  tablet Take 0.6 mg by mouth daily as needed (for gout).   Yes Historical Provider, MD  cyclobenzaprine (FLEXERIL) 10 MG tablet Take 1 tablet (10 mg total) by mouth 3 (three) times daily as needed for muscle spasms. 07/03/14  Yes Mercedes Camprubi-Soms, PA-C  ergocalciferol (VITAMIN D2) 50000 UNITS capsule Take 50,000 Units by mouth once a week. Friday   Yes Historical Provider, MD  glimepiride (AMARYL) 4 MG tablet Take 4 mg by mouth daily with breakfast.   Yes Historical Provider, MD  HYDROcodone-acetaminophen (NORCO) 5-325 MG per tablet Take 1-2 tablets by mouth every 6 (six) hours as needed for severe pain. 07/03/14  Yes Mercedes Camprubi-Soms, PA-C  latanoprost (XALATAN) 0.005 % ophthalmic solution Place 1 drop into both eyes as needed (glaucoma).  04/22/14  Yes Historical Provider, MD  lisinopril-hydrochlorothiazide (PRINZIDE,ZESTORETIC) 20-25 MG per tablet Take 1 tablet by mouth daily.   Yes Historical Provider, MD  naproxen (NAPROSYN) 500 MG tablet Take 1 tablet (500 mg total) by mouth 2 (two) times daily as needed for mild pain, moderate pain or headache (TAKE WITH MEALS.). 07/03/14  Yes Mercedes Camprubi-Soms, PA-C  simvastatin (ZOCOR) 20 MG tablet Take 20 mg by mouth daily. 06/16/14  Yes Historical Provider, MD  HYDROcodone-acetaminophen (NORCO) 5-325 MG per tablet Take 2 tablets by mouth every 4 (four) hours as needed. Patient not taking: Reported on 05/03/2014 10/12/13   Mancel BaleElliott Wentz,  MD  ondansetron (ZOFRAN ODT) 4 MG disintegrating tablet 4mg  ODT q4 hours prn nausea/vomit Patient not taking: Reported on 07/03/2014 05/03/14   Purvis SheffieldForrest Harrison, MD  predniSONE (DELTASONE) 10 MG tablet Take 2 tablets (20 mg total) by mouth daily. 07/07/14   Arby BarretteMarcy Dahl Higinbotham, MD  tamsulosin (FLOMAX) 0.4 MG CAPS capsule 1 q HS to aid stone passage Patient not taking: Reported on 05/03/2014 10/12/13   Mancel BaleElliott Wentz, MD   BP 181/68 mmHg  Pulse 114  Temp(Src) 99.2 F (37.3 C) (Oral)  Resp 18  Ht 5\' 2"  (1.575 m)  Wt 198 lb  (89.812 kg)  BMI 36.21 kg/m2  SpO2 94% Physical Exam  Constitutional: She is oriented to person, place, and time.  Patient is morbidly central obesity and is otherwise well in appearance with no respiratory distress.  HENT:  Head: Normocephalic and atraumatic.  Eyes: EOM are normal.  Neck: Neck supple.  Cardiovascular: Normal rate, regular rhythm, normal heart sounds and intact distal pulses.   Pulmonary/Chest: Effort normal and breath sounds normal.  Abdominal: Soft. Bowel sounds are normal. She exhibits no distension. There is no tenderness.  Musculoskeletal: Normal range of motion. She exhibits no edema or tenderness.  Based on the patient's description of her activities her muscular development of her lower extremities is actually quite good without appears a significant muscular atrophy. She has no peripheral edema the calves are well formed. The lower legs are in good condition. She does have intact range of motion to flexion and extension. She however reports that it causes pain in her lower back and she indicates her SI regions as the legs are put through flexion and extension.  Neurological: She is alert and oriented to person, place, and time. She has normal strength. She exhibits normal muscle tone. Coordination normal. GCS eye subscore is 4. GCS verbal subscore is 5. GCS motor subscore is 6.  Skin: Skin is warm, dry and intact.  Psychiatric: She has a normal mood and affect.    ED Course  Procedures (including critical care time) Labs Review Labs Reviewed  URINALYSIS, ROUTINE W REFLEX MICROSCOPIC - Abnormal; Notable for the following:    APPearance CLOUDY (*)    Ketones, ur 15 (*)    Protein, ur 30 (*)    All other components within normal limits  CBC WITH DIFFERENTIAL/PLATELET - Abnormal; Notable for the following:    WBC 11.3 (*)    Hemoglobin 11.6 (*)    Neutrophils Relative % 87 (*)    Neutro Abs 9.7 (*)    Lymphocytes Relative 8 (*)    All other components within  normal limits  URINE MICROSCOPIC-ADD ON - Abnormal; Notable for the following:    Squamous Epithelial / LPF FEW (*)    Bacteria, UA MANY (*)    Casts HYALINE CASTS (*)    All other components within normal limits  BASIC METABOLIC PANEL    Imaging Review [place] Aorta  07/07/2014   CLINICAL DATA:  Back and right hip pain for 5 days  EXAM: ULTRASOUND AORTA  TECHNIQUE: Ultrasound examination of the abdominal aorta was performed to evaluate for abdominal aortic aneurysm. The common iliac arteries, IVC, and kidneys were also evaluated.  COMPARISON:  None.  FINDINGS: Abdominal Aorta  No aneurysm identified.  Maximum AP  Diameter:  2.1  Maximum TRV  Diameter: 2.6  Right Common Iliac Artery  No aneurysm identified.  Left Common Iliac Artery  No aneurysm identified.  IVC  No abnormality visualized.  IMPRESSION:  1. Ectatic abdominal aorta at risk for aneurysm development. Recommend follow up by [place] in 5 years. This recommendation follows ACR consensus guidelines: White Paper of the ACR Incidental Findings Committee II on Vascular Findings. J Am Coll Radiol 2013; 10:789-794.   Electronically Signed   By: Signa Kell M.D.   On: 07/07/2014 16:25   Ct Renal Stone Study  07/07/2014   CLINICAL DATA:  back pain, right lower back pain, muscle spasm, hip pain  EXAM: CT ABDOMEN AND PELVIS WITHOUT CONTRAST  TECHNIQUE: Multidetector CT imaging of the abdomen and pelvis was performed following the standard protocol without IV contrast.  COMPARISON:  10/12/2013  FINDINGS: Lung bases are unremarkable. There are streaky artifacts from patient's large body habitus. Small hiatal hernia is noted. Sagittal images of the spine are unremarkable. Atherosclerotic calcifications of abdominal aorta, celiac trunk, splenic and hepatic artery, SMA and iliac arteries are noted. No aortic aneurysm. Unenhanced liver shows no biliary ductal dilatation. Unenhanced pancreas, spleen and adrenal glands are unremarkable. Unenhanced kidneys are  symmetrical in size. No hydronephrosis or hydroureter. At least 3 or 4 nonobstructive calcifications are noted in mid and lower pole of the right kidney the largest in lower pole measures 3 mm.  No hydronephrosis that no calcified ureteral calculi are noted bilaterally.  Pelvic phleboliths are noted.  Bilateral distal ureter is unremarkable. The patient is status post hysterectomy.  There is no small bowel obstruction. Normal appendix. Terminal ileum is unremarkable. Some stool noted within cecum. No pericecal inflammation.  The urinary bladder is unremarkable. Additional axial images of the right hip performed. There is no hip fracture. Coronal images shows bilateral hip joints to be symmetrical in appearance. Mild narrowing of superior hip joint space bilateral hip joints. Bilateral femoral neck is intact.  IMPRESSION: 1. There is right nonobstructive nephrolithiasis. No hydronephrosis or hydroureter. 2. No calcified ureteral calculi are noted bilaterally. Multiple pelvic phleboliths are again noted. 3. Normal appendix.  No pericecal inflammation. 4. No right hip fracture. Mild degenerative changes bilateral hip joints.   Electronically Signed   By: Natasha Mead M.D.   On: 07/07/2014 14:46     EKG Interpretation None      MDM   Final diagnoses:  Sciatica, right  Spondylosis of lumbosacral region without myelopathy or radiculopathy   At this time the patient presents with a migratory muscular/arthralgia pain. On her prior visit pain was localized at the left hip. Today pain is predominantly in the right SI joint region. There does not appear to be any associated neurologic dysfunction. The patient does have very poor body habitus and likely is significantly limited in her mobility due to this. She is advised follow-up with her family doctor for referral to orthopedics and probable physical therapy. She will be given several days of prednisone to try to decrease inflammation related pain. Previously been  prescribed Vicodin and muscle relaxer.    Arby Barrette, MD 07/07/14 [id number]

## 2014-07-07 NOTE — ED Notes (Signed)
Pt not in room 14:13

## 2014-07-07 NOTE — Discharge Instructions (Signed)
Osteoarthritis Osteoarthritis is a disease that causes soreness and inflammation of a joint. It occurs when the cartilage at the affected joint wears down. Cartilage acts as a cushion, covering the ends of bones where they meet to form a joint. Osteoarthritis is the most common form of arthritis. It often occurs in older people. The joints affected most often by this condition include those in the:  Ends of the fingers.  Thumbs.  Neck.  Lower back.  Knees.  Hips. CAUSES  Over time, the cartilage that covers the ends of bones begins to wear away. This causes bone to rub on bone, producing pain and stiffness in the affected joints.  RISK FACTORS Certain factors can increase your chances of having osteoarthritis, including:  Older age.  Excessive body weight.  Overuse of joints.  Previous joint injury. SIGNS AND SYMPTOMS   Pain, swelling, and stiffness in the joint.  Over time, the joint may lose its normal shape.  Small deposits of bone (osteophytes) may grow on the edges of the joint.  Bits of bone or cartilage can break off and float inside the joint space. This may cause more pain and damage. DIAGNOSIS  Your health care provider will do a physical exam and ask about your symptoms. Various tests may be ordered, such as:  X-rays of the affected joint.  An MRI scan.  Blood tests to rule out other types of arthritis.  Joint fluid tests. This involves using a needle to draw fluid from the joint and examining the fluid under a microscope. TREATMENT  Goals of treatment are to control pain and improve joint function. Treatment plans may include:  A prescribed exercise program that allows for rest and joint relief.  A weight control plan.  Pain relief techniques, such as:  Properly applied heat and cold.  Electric pulses delivered to nerve endings under the skin (transcutaneous electrical nerve stimulation [TENS]).  Massage.  Certain nutritional  supplements.  Medicines to control pain, such as:  Acetaminophen.  Nonsteroidal anti-inflammatory drugs (NSAIDs), such as naproxen.  Narcotic or central-acting agents, such as tramadol.  Corticosteroids. These can be given orally or as an injection.  Surgery to reposition the bones and relieve pain (osteotomy) or to remove loose pieces of bone and cartilage. Joint replacement may be needed in advanced states of osteoarthritis. HOME CARE INSTRUCTIONS   Take medicines only as directed by your health care provider.  Maintain a healthy weight. Follow your health care provider's instructions for weight control. This may include dietary instructions.  Exercise as directed. Your health care provider can recommend specific types of exercise. These may include:  Strengthening exercises. These are done to strengthen the muscles that support joints affected by arthritis. They can be performed with weights or with exercise bands to add resistance.  Aerobic activities. These are exercises, such as brisk walking or low-impact aerobics, that get your heart pumping.  Range-of-motion activities. These keep your joints limber.  Balance and agility exercises. These help you maintain daily living skills.  Rest your affected joints as directed by your health care provider.  Keep all follow-up visits as directed by your health care provider. SEEK MEDICAL CARE IF:   Your skin turns red.  You develop a rash in addition to your joint pain.  You have worsening joint pain.  You have a fever along with joint or muscle aches. SEEK IMMEDIATE MEDICAL CARE IF:  You have a significant loss of weight or appetite.  You have night sweats. FOR MORE  INFORMATION   [employer] Arth[employer] and Musculoskeletal and Skin Diseases: www.niams.http://www.myers.net/nih.gov  [employer] Aging: https://walker.com/www.nia.nih.gov  American College of Rheumatology: www.rheumatology.org Document Released: 02/14/2005 Document Revised:  07/01/2013 Document Reviewed: 10/22/2012 [facility] Patient Information 2015 [place]ExitCare, [place]. This information is not intended to replace advice given to you by your health care provider. Make sure you discuss any questions you have with your health care provider. Sacroiliac Joint Dysfunction The sacroiliac joint connects the lower part of the spine (the sacrum) with the bones of the pelvis. CAUSES  Sometimes, there is no obvious reason for sacroiliac joint dysfunction. Other times, it may occur   During pregnancy.  After injury, such as:  Car accidents.  Sport-related injuries.  Work-related injuries.  Due to one leg being shorter than the other.  Due to other conditions that affect the joints, such as:  Rheumatoid arthritis.  Gout.  Psoriasis.  Joint infection (septic arthritis). SYMPTOMS  Symptoms may include:  Pain in the:  Lower back.  Buttocks.  Groin.  Thighs and legs.  Difficult sitting, standing, walking, lying, bending or lifting. DIAGNOSIS  A number of tests may be used to help diagnose the cause of sacroiliac joint dysfunction, including:  Imaging tests to look for other causes of pain, including:  MRI.  CT scan.  Bone scan.  Diagnostic injection: During a special x-ray (called fluoroscopy), a needle is put into the sacroiliac joint. A numbing medicine is injected into the joint. If the pain is improved or stopped, the diagnosis of sacroiliac joint dysfunction is more likely. TREATMENT  There are a number of types of treatment used for sacroiliac joint dysfunction, including:  Only take over-the-counter or prescription medicines for pain, discomfort, or fever as directed by your caregiver.  Medications to relax muscles.  Rest. Decreasing activity can help cut down on painful muscle spasms and allow the back to heal.  Application of heat or ice to the lower back may improve muscle spasms and soothe pain.  Brace. A special back brace, called a  sacroiliac belt, can help support the joint while your back is healing.  Physical therapy can help teach comfortable positions and exercises to strengthen muscles that support the sacroiliac joint.  Cortisone injections. Injections of steroid medicine into the joint can help decrease swelling and improve pain.  Hyaluronic acid injections. This chemical improves lubrication within the sacroiliac joint, thereby decreasing pain.  Radiofrequency ablation. A special needle is placed into the joint, where it burns away nerves that are carrying pain messages from the joint.  Surgery. Because pain occurs during movement of the joint, screws and plates may be installed in order to limit or prevent joint motion. HOME CARE INSTRUCTIONS   Take all medications exactly as directed.  Follow instructions regarding both rest and physical activity, to avoid worsening the pain.  Do physical therapy exercises exactly as prescribed. SEEK IMMEDIATE MEDICAL CARE IF:  You experience increasingly severe pain.  You develop new symptoms, such as numbness or tingling in your legs or feet.  You lose bladder or bowel control. Document Released: 05/13/2008 Document Revised: 05/09/2011 Document Reviewed: 05/13/2008 [facility]ExitCare Patient Information 2015 [place]Care, [place]LL[place]. This information is not intended to replace advice given to you by your health care provider. Make sure you discuss any questions you have with your health care provider.

## 2014-08-08 ENCOUNTER — Emergency Department (HOSPITAL_COMMUNITY): Payer: Medicare Other

## 2014-08-08 ENCOUNTER — Emergency Department (HOSPITAL_COMMUNITY)
Admission: EM | Admit: 2014-08-08 | Discharge: 2014-08-08 | Disposition: A | Discharge status: Home / Self Care | Payer: Medicare Other | Attending: Emergency Medicine | Admitting: Emergency Medicine

## 2014-08-08 ENCOUNTER — Encounter (HOSPITAL_COMMUNITY): Payer: Self-pay | Admitting: Emergency Medicine

## 2014-08-08 DIAGNOSIS — Z79899 Other long term (current) drug therapy: Secondary | ICD-10-CM | POA: Diagnosis not present

## 2014-08-08 DIAGNOSIS — I10 Essential (primary) hypertension: Secondary | ICD-10-CM | POA: Diagnosis not present

## 2014-08-08 DIAGNOSIS — M545 Low back pain: Secondary | ICD-10-CM | POA: Insufficient documentation

## 2014-08-08 DIAGNOSIS — Z7982 Long term (current) use of aspirin: Secondary | ICD-10-CM | POA: Diagnosis not present

## 2014-08-08 DIAGNOSIS — E119 Type 2 diabetes mellitus without complications: Secondary | ICD-10-CM | POA: Insufficient documentation

## 2014-08-08 DIAGNOSIS — R319 Hematuria, unspecified: Secondary | ICD-10-CM | POA: Insufficient documentation

## 2014-08-08 DIAGNOSIS — E785 Hyperlipidemia, unspecified: Secondary | ICD-10-CM | POA: Insufficient documentation

## 2014-08-08 DIAGNOSIS — M1990 Unspecified osteoarthritis, unspecified site: Secondary | ICD-10-CM | POA: Insufficient documentation

## 2014-08-08 DIAGNOSIS — Z8673 Personal history of transient ischemic attack (TIA), and cerebral infarction without residual deficits: Secondary | ICD-10-CM | POA: Diagnosis not present

## 2014-08-08 DIAGNOSIS — Z87442 Personal history of urinary calculi: Secondary | ICD-10-CM | POA: Diagnosis not present

## 2014-08-08 DIAGNOSIS — M549 Dorsalgia, unspecified: Secondary | ICD-10-CM

## 2014-08-08 LAB — URINALYSIS, ROUTINE W REFLEX MICROSCOPIC
Bilirubin Urine: NEGATIVE
Glucose, UA: 100 mg/dL — AB
KETONES UR: NEGATIVE mg/dL
Leukocytes, UA: NEGATIVE
Nitrite: NEGATIVE
Protein, ur: 100 mg/dL — AB
Specific Gravity, Urine: 1.018 (ref 1.005–1.030)
Urobilinogen, UA: 0.2 mg/dL (ref 0.0–1.0)
pH: 5 (ref 5.0–8.0)

## 2014-08-08 LAB — CBC WITH DIFFERENTIAL/PLATELET
BASOS PCT: 0 % (ref 0–1)
Basophils Absolute: 0 10*3/uL (ref 0.0–0.1)
EOS ABS: 0.1 10*3/uL (ref 0.0–0.7)
EOS PCT: 1 % (ref 0–5)
HCT: 35.3 % — ABNORMAL LOW (ref 36.0–46.0)
HEMOGLOBIN: 11.2 g/dL — AB (ref 12.0–15.0)
Lymphocytes Relative: 10 % — ABNORMAL LOW (ref 12–46)
Lymphs Abs: 0.9 10*3/uL (ref 0.7–4.0)
MCH: 26.5 pg (ref 26.0–34.0)
MCHC: 31.7 g/dL (ref 30.0–36.0)
MCV: 83.6 fL (ref 78.0–100.0)
MONO ABS: 0.6 10*3/uL (ref 0.1–1.0)
Monocytes Relative: 6 % (ref 3–12)
NEUTROS PCT: 83 % — AB (ref 43–77)
Neutro Abs: 8.2 10*3/uL — ABNORMAL HIGH (ref 1.7–7.7)
Platelets: 231 10*3/uL (ref 150–400)
RBC: 4.22 MIL/uL (ref 3.87–5.11)
RDW: 14.8 % (ref 11.5–15.5)
WBC: 9.7 10*3/uL (ref 4.0–10.5)

## 2014-08-08 LAB — BASIC METABOLIC PANEL
Anion gap: 10 (ref 5–15)
BUN: 14 mg/dL (ref 6–20)
CO2: 26 mmol/L (ref 22–32)
Calcium: 9.3 mg/dL (ref 8.9–10.3)
Chloride: 102 mmol/L (ref 101–111)
Creatinine, Ser: 1.14 mg/dL — ABNORMAL HIGH (ref 0.44–1.00)
GFR, EST AFRICAN AMERICAN: 54 mL/min — AB (ref >60)
GFR, EST NON AFRICAN AMERICAN: 47 mL/min — AB (ref >60)
GLUCOSE: 189 mg/dL — AB (ref 65–99)
Potassium: 3.5 mmol/L (ref 3.5–5.1)
Sodium: 138 mmol/L (ref 135–145)

## 2014-08-08 LAB — URINE MICROSCOPIC-ADD ON

## 2014-08-08 MED ORDER — HYDROCODONE-ACETAMINOPHEN 5-325 MG PO TABS: 1.0000 | ORAL_TABLET | Freq: Four times a day (QID) | ORAL | Status: AC | PRN

## 2014-08-08 MED ORDER — CYCLOBENZAPRINE HCL 10 MG PO TABS
10.0000 mg | ORAL_TABLET | Freq: Once | ORAL | Status: AC
  Administered 2014-08-08: 10 mg via ORAL
  Filled 2014-08-08: qty 1

## 2014-08-08 MED ORDER — HYDROMORPHONE HCL 2 MG/ML IJ SOLN
2.0000 mg | Freq: Once | INTRAMUSCULAR | Status: AC
  Administered 2014-08-08: 2 mg via INTRAMUSCULAR
  Filled 2014-08-08: qty 1

## 2014-08-08 NOTE — ED Notes (Signed)
Delay in lab draw, pt receiving peri care. 

## 2014-08-08 NOTE — Care Management Note (Signed)
Case Management Note  Patient Details  Name: [name] MRN: [id number] Date of Birth: January 29, 1941  Subjective/Objective:   Patient presents to Ed with back pain                 Action/Plan:Discussed home health services with patient and her son at bedside  Expected Discharge Date:  08/08/2014                Expected Discharge Plan:  Home w Home Health Services  In-House Referral:     Discharge planning Services  CM Consult  Post Acute Care Choice:  Durable Medical Equipment, Home Health Choice offered to:  Patient, Adult Children  DME Arranged:  Wheelchair manual, Hospital bed DME Agency:  [facility]  HH Arranged:  RN, PT, OT, Nurse's Aide (social work) [facility] Agency:  [facility]  Status of Service:  Completed, signed off  Medicare Important Message Given:    Date Medicare IM Given:    Medicare IM give by:    Date Additional Medicare IM Given:    Additional Medicare Important Message give by:     If discussed at Long Length of Stay Meetings, dates discussed:    Additional Comments:  Patient lives alone at home.  Patient receives private duty nursing services with [facility] who come out everyday (not weekends) from 9am to 1130am.  Patient's son Jorja Loa reports he would like someone to come out in the evening.  EDCM suggested to patient's son to call patient's private duty nursing agency to see if patient's hours can be increased or changed so that patient can receive more services in the evening.  EDCM provided patient's son with list of private duty nursing agencies highlighting [facility].  Patient's son Tim's phone number [phone].  Patient's address confirmed with patient's son.  Patient's son reports he takes patient to her doctor's appointments.  Patient reports she has an upcoming appointment with her pcp Dr. Concepcion Elk on June 23rd.  Patient reports she has had home health services in the past with [facility] and was very pleased with  their services.  Patient gave [facility] permission to make home health referral to [facility]  [facility] consult placed.  Discussed Medicare guidelines for SNF placement.  Patient also has Medicaid insurance.  Will order social worker with home health services to assist with ALF placement if needed.  Patient has a walker and a bedside commode at home.  Patient requesting hospital bed and wheelchair.  EDCM discussed patient with EDP who placed home health orders for RN, PT, OT aide and social worker, hospital bed and wheelchair.  [facility] will fax referral to [facility].  Patient and patient's son thankful for services.  No further EDCM needs at this time.  Bennie Dallas, Kyaira Trantham, RN 08/08/2014, 5:05 PM

## 2014-08-08 NOTE — ED Notes (Signed)
Case mgt at bedside to help with home care.  Attempted to sit pt up in the stretcher, pt started to yell stating she cannot move d/t severe back pain.  Pt is requesting for more pain med, pt made aware that she was too sedated when she was given pain meds and that she is going to be discharged.

## 2014-08-08 NOTE — Discharge Instructions (Signed)
Back Pain, Adult Low back pain is very common. About 1 in 5 people have back pain.The cause of low back pain is rarely dangerous. The pain often gets better over time.About half of people with a sudden onset of back pain feel better in just 2 weeks. About 8 in 10 people feel better by 6 weeks.  CAUSES Some common causes of back pain include:  Strain of the muscles or ligaments supporting the spine.  Wear and tear (degeneration) of the spinal discs.  Arthritis.  Direct injury to the back. DIAGNOSIS Most of the time, the direct cause of low back pain is not known.However, back pain can be treated effectively even when the exact cause of the pain is unknown.Answering your caregiver's questions about your overall health and symptoms is one of the most accurate ways to make sure the cause of your pain is not dangerous. If your caregiver needs more information, he or she may order lab work or imaging tests (X-rays or MRIs).However, even if imaging tests show changes in your back, this usually does not require surgery. HOME CARE INSTRUCTIONS For many people, back pain returns.Since low back pain is rarely dangerous, it is often a condition that people can learn to manageon their own.   Remain active. It is stressful on the back to sit or stand in one place. Do not sit, drive, or stand in one place for more than 30 minutes at a time. Take short walks on level surfaces as soon as pain allows.Try to increase the length of time you walk each day.  Do not stay in bed.Resting more than 1 or 2 days can delay your recovery.  Do not avoid exercise or work.Your body is made to move.It is not dangerous to be active, even though your back may hurt.Your back will likely heal faster if you return to being active before your pain is gone.  Pay attention to your body when you bend and lift. Many people have less discomfortwhen lifting if they bend their knees, keep the load close to their bodies,and  avoid twisting. Often, the most comfortable positions are those that put less stress on your recovering back.  Find a comfortable position to sleep. Use a firm mattress and lie on your side with your knees slightly bent. If you lie on your back, put a pillow under your knees.  Only take over-the-counter or prescription medicines as directed by your caregiver. Over-the-counter medicines to reduce pain and inflammation are often the most helpful.Your caregiver may prescribe muscle relaxant drugs.These medicines help dull your pain so you can more quickly return to your normal activities and healthy exercise.  Put ice on the injured area.  Put ice in a plastic bag.  Place a towel between your skin and the bag.  Leave the ice on for 15-20 minutes, 03-04 times a day for the first 2 to 3 days. After that, ice and heat may be alternated to reduce pain and spasms.  Ask your caregiver about trying back exercises and gentle massage. This may be of some benefit.  Avoid feeling anxious or stressed.Stress increases muscle tension and can worsen back pain.It is important to recognize when you are anxious or stressed and learn ways to manage it.Exercise is a great option. SEEK MEDICAL CARE IF:  You have pain that is not relieved with rest or medicine.  You have pain that does not improve in 1 week.  You have new symptoms.  You are generally not feeling well. SEEK   IMMEDIATE MEDICAL CARE IF:   You have pain that radiates from your back into your legs.  You develop new bowel or bladder control problems.  You have unusual weakness or numbness in your arms or legs.  You develop nausea or vomiting.  You develop abdominal pain.  You feel faint. Document Released: 02/14/2005 Document Revised: 08/16/2011 Document Reviewed: 06/18/2013 ExitCare Patient Information 2015 ExitCare, LLC. This information is not intended to replace advice given to you by your health care provider. Make sure you  discuss any questions you have with your health care provider.  

## 2014-08-08 NOTE — ED Notes (Signed)
Bed: [facility]B Expected date:  Expected time:  Means of arrival:  Comments: 

## 2014-08-08 NOTE — ED Notes (Signed)
C/o lower back pain radiating to LLE, not responsive to percocet.  Received 4 mg zofran and 150 of fentanyl enroute.

## 2014-08-08 NOTE — ED Notes (Signed)
[facility] contacted for tranport

## 2014-08-08 NOTE — ED Notes (Signed)
Discharge instructions given to pt's son at bedside.  He will be in pt's apt to open door for when transport arrives with pt

## 2014-08-08 NOTE — ED Notes (Signed)
Delay on lab draw, pt in exray 

## 2014-08-08 NOTE — ED Notes (Signed)
Bed: SA63 Expected date:  Expected time:  Means of arrival:  Comments: sciatica

## 2014-08-08 NOTE — ED Notes (Signed)
Patient transported to CT 

## 2014-08-08 NOTE — ED Provider Notes (Signed)
CSN: [id number]     Arrival date & time 08/08/14  1137 History   First MD Initiated Contact with Patient 08/08/14 1144     Chief Complaint  Patient presents with  . Back Pain     (Consider location/radiation/quality/duration/timing/severity/associated sxs/prior Treatment) Patient is a 74 y.o. female presenting with back pain. The history is provided by the patient. No language interpreter was used.  Back Pain Location:  Lumbar spine Quality:  Aching Radiates to:  Does not radiate Pain severity:  Moderate Pain is:  Same all the time Onset quality:  Gradual Duration:  4 days Timing:  Constant Progression:  Worsening Chronicity:  Recurrent Relieved by: being still. Worsened by:  Movement Ineffective treatments:  Being still Associated symptoms: bladder incontinence (unchanged, chronic)   Associated symptoms: no abdominal pain, no bowel incontinence, no chest pain, no dysuria, no fever, no headaches, no numbness, no pelvic pain, no perianal numbness, no tingling, no weakness and no weight loss   Risk factors: lack of exercise and obesity     Past Medical History  Diagnosis Date  . Hypertension   . Diabetes mellitus   . TIA (transient ischemic attack)   . Hyperlipidemia   . Arthritis   . Kidney stone on right side 5/15  . Ureteral stricture, right 5/15   Past Surgical History  Procedure Laterality Date  . Cholecystectomy    . Abdominal hysterectomy     History reviewed. No pertinent family history. History  Substance Use Topics  . Smoking status: Never Smoker   . Smokeless tobacco: Not on file  . Alcohol Use: No   OB History    No data available     Review of Systems  Constitutional: Negative for fever, chills, weight loss, diaphoresis, activity change, appetite change and fatigue.  HENT: Negative for congestion, facial swelling, rhinorrhea and sore throat.   Eyes: Negative for photophobia and discharge.  Respiratory: Negative for cough, chest tightness and  shortness of breath.   Cardiovascular: Negative for chest pain, palpitations and leg swelling.  Gastrointestinal: Negative for nausea, vomiting, abdominal pain, diarrhea and bowel incontinence.  Endocrine: Negative for polydipsia and polyuria.  Genitourinary: Positive for bladder incontinence (unchanged, chronic). Negative for dysuria, frequency, difficulty urinating and pelvic pain.  Musculoskeletal: Positive for back pain. Negative for arthralgias, neck pain and neck stiffness.  Skin: Negative for color change and wound.  Allergic/Immunologic: Negative for immunocompromised state.  Neurological: Negative for tingling, facial asymmetry, weakness, numbness and headaches.  Hematological: Does not bruise/bleed easily.  Psychiatric/Behavioral: Negative for confusion and agitation.      Allergies  Metformin and related  Home Medications   Prior to Admission medications   Medication Sig Start Date End Date Taking? Authorizing Provider  acetaminophen (TYLENOL) 500 MG tablet Take 1,000 mg by mouth every 6 (six) hours as needed for moderate pain.   Yes Historical Provider, MD  allopurinol (ZYLOPRIM) 100 MG tablet Take 100 mg by mouth daily. 06/18/14  Yes Historical Provider, MD  aspirin EC 81 MG tablet Take 81 mg by mouth daily.   Yes Historical Provider, MD  colchicine 0.6 MG tablet Take 0.6 mg by mouth daily as needed (for gout).   Yes Historical Provider, MD  cyclobenzaprine (FLEXERIL) 10 MG tablet Take 1 tablet (10 mg total) by mouth 3 (three) times daily as needed for muscle spasms. 07/03/14  Yes Mercedes Camprubi-Soms, PA-C  glimepiride (AMARYL) 4 MG tablet Take 4 mg by mouth daily with breakfast.   Yes Historical Provider, MD  latanoprost (XALATAN) 0.005 % ophthalmic solution Place 1 drop into both eyes as needed (glaucoma).  04/22/14  Yes Historical Provider, MD  lisinopril-hydrochlorothiazide (PRINZIDE,ZESTORETIC) 20-25 MG per tablet Take 1 tablet by mouth daily.   Yes Historical Provider,  MD  naproxen (NAPROSYN) 500 MG tablet Take 1 tablet (500 mg total) by mouth 2 (two) times daily as needed for mild pain, moderate pain or headache (TAKE WITH MEALS.). 07/03/14  Yes Mercedes Camprubi-Soms, PA-C  simvastatin (ZOCOR) 10 MG tablet Take 10 mg by mouth daily at 6 PM.   Yes Historical Provider, MD  ergocalciferol (VITAMIN D2) 50000 UNITS capsule Take 50,000 Units by mouth once a week. Friday    Historical Provider, MD  HYDROcodone-acetaminophen (NORCO) 5-325 MG per tablet Take 1 tablet by mouth every 6 (six) hours as needed. 08/08/14   Toy Cookey, MD  predniSONE (DELTASONE) 10 MG tablet Take 2 tablets (20 mg total) by mouth daily. Patient not taking: Reported on 08/08/2014 07/07/14   Arby Barrette, MD   BP 160/74 mmHg  Pulse 104  Temp(Src) 99 F (37.2 C) (Oral)  Resp 14  SpO2 95% Physical Exam  Constitutional: She is oriented to person, place, and time. She appears well-developed and well-nourished. No distress.  HENT:  Head: Normocephalic and atraumatic.  Mouth/Throat: No oropharyngeal exudate.  Eyes: Pupils are equal, round, and reactive to light.  Neck: Normal range of motion. Neck supple.  Cardiovascular: Normal rate, regular rhythm and normal heart sounds.  Exam reveals no gallop and no friction rub.   No murmur heard. Pulmonary/Chest: Effort normal and breath sounds normal. No respiratory distress. She has no wheezes. She has no rales.  Abdominal: Soft. Bowel sounds are normal. She exhibits no distension and no mass. There is no tenderness. There is no rebound and no guarding.  Genitourinary:  nml rectal tone and perianal sensation  Musculoskeletal: Normal range of motion. She exhibits no edema or tenderness.       Back:  Neurological: She is alert and oriented to person, place, and time. She has normal strength. She displays no tremor. No cranial nerve deficit or sensory deficit. She exhibits normal muscle tone. GCS eye subscore is 4. GCS verbal subscore is 5. GCS motor  subscore is 6.  Skin: Skin is warm and dry.  Psychiatric: She has a normal mood and affect.    ED Course  Procedures (including critical care time) Labs Review Labs Reviewed  CBC WITH DIFFERENTIAL/PLATELET - Abnormal; Notable for the following:    Hemoglobin 11.2 (*)    HCT 35.3 (*)    Neutrophils Relative % 83 (*)    Neutro Abs 8.2 (*)    Lymphocytes Relative 10 (*)    All other components within normal limits  BASIC METABOLIC PANEL - Abnormal; Notable for the following:    Glucose, Bld 189 (*)    Creatinine, Ser 1.14 (*)    GFR calc non Af Amer 47 (*)    GFR calc Af Amer 54 (*)    All other components within normal limits  URINALYSIS, ROUTINE W REFLEX MICROSCOPIC (NOT AT [facility]) - Abnormal; Notable for the following:    APPearance CLOUDY (*)    Glucose, UA 100 (*)    Hgb urine dipstick MODERATE (*)    Protein, ur 100 (*)    All other components within normal limits  URINE MICROSCOPIC-ADD ON - Abnormal; Notable for the following:    Squamous Epithelial / LPF MANY (*)    Bacteria, UA MANY (*)  All other components within normal limits  URINE CULTURE    Imaging Review Dg Lumbar Spine Complete  08/08/2014   CLINICAL DATA:  Severe back pain this morning with left leg pain. No known injury.  EXAM: LUMBAR SPINE - COMPLETE 4+ VIEW  COMPARISON:  CT scan 07/07/2014  FINDINGS: Normal alignment of the lumbar vertebral bodies. No acute bony findings or destructive bony changes. Advanced facet disease appears stable. No definite pars defects. Significant chronic bilateral SI joint degenerative disease noted. Advanced aortic calcifications without definite aneurysm.  IMPRESSION: Degenerative changes but no acute bony findings.  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   Electronically Signed   By: Rudie Meyer M.D.   On: 08/08/2014 13:11   Ct Renal Stone Study  08/08/2014   CLINICAL DATA:  Low back pain radiating to the left. Assess for renal stone disease.  EXAM: CT  ABDOMEN AND PELVIS WITHOUT CONTRAST  TECHNIQUE: Multidetector CT imaging of the abdomen and pelvis was performed following the standard protocol without IV contrast.  COMPARISON:  07/07/2014  FINDINGS: Lung bases are clear. No pleural or pericardial fluid. The liver has a normal appearance without contrast. Previous cholecystectomy. The spleen is normal. The pancreas is normal. The adrenal glands are normal. The left kidney is normal without evidence of cyst, stone, mass or hydronephrosis. The right kidney contains a few small calculi as seen previously but there is no evidence of passing stone or hydronephrosis. There is atherosclerosis of the aorta and its branch vessels. The IVC is normal. Multiple phleboliths are present within retroperitoneal veins. The appendix is normal. The bladder is normal. There has been previous hysterectomy. No pelvic mass or fluid. No other acute bowel pathology. Moderate amount of gas in the transverse colon. No evidence of spinal fracture. Ordinary facet degenerative change in the lower lumbar spine.  IMPRESSION: No acute finding. No cause of the presenting symptoms is identified. Nonobstructing calculi in the right kidney as seen previously. No stone disease on the left.  Atherosclerosis of the aorta and its branch vessels without aneurysm.   Electronically Signed   By: Paulina Fusi M.D.   On: 08/08/2014 15:48     EKG Interpretation None      MDM   Final diagnoses:  Back pain  Hematuria    Pt is a 74 y.o. female with Pmhx as above who presents with about 4 days of midline low back pain w/o radiation, which is not painful when she is still but very painful with any sort of movement.  She has had no falls or injuries and is wheelchair-bound at home.  She took a Percocet that she was given when she had pain like this about a month ago and did not have improvement at home.  She states that she has been unable to get up into the bathroom due to pain and has had, worsening  chronic urinary incontinence, but no bowel incontinence.  No fevers, no perianal anesthesia's.  She's had rare intermittent paresthesias to the left leg.  On physical exam, patient is mildly tachycardic but is uncomfortable due to pain.  She is reproducible midline low back pain.  She is pain with movement of bilateral legs, generalized decreased strength and normal sensation.  Rectal tone is normal.  No perianal anesthesias, I feel cauda/equina/cord compression, subdural hematoma unlikely.  Will get XR to look for compression deformity, basic labs, UA.   XR with SI degeneration BL, pain improved. UA not infected but does have large blood,  Will get CT stone study.  Stone study w/o acute finding, as ai doubt acute surgical cause of back pain, will d/c home with short course of pain meds, will not given steroids due to DM. Will ask to f/u with PCP. Pt's son requesting case mgmt consult for home health services. I do not feel pt meet requires for admission.    Macky Lower evaluation in the Emergency Department is complete. It has been determined that no acute conditions requiring further emergency intervention are present at this time. The patient/guardian have been advised of the diagnosis and plan. We have discussed signs and symptoms that warrant return to the ED, such as changes or worsening in symptoms, worsening pain, fever, bowel or bladder incontinence.        Toy Cookey, MD 08/08/14 [phone]

## 2014-08-09 LAB — URINE CULTURE: Colony Count: 50000

## 2014-11-12 ENCOUNTER — Ambulatory Visit (INDEPENDENT_AMBULATORY_CARE_PROVIDER_SITE_OTHER): Payer: Medicare Other | Admitting: Podiatry

## 2014-11-12 ENCOUNTER — Encounter: Payer: Self-pay | Admitting: Podiatry

## 2014-11-12 VITALS — BP 122/60 | HR 92 | Temp 98.6°F | Resp 12

## 2014-11-12 DIAGNOSIS — M79676 Pain in unspecified toe(s): Secondary | ICD-10-CM

## 2014-11-12 DIAGNOSIS — M1000 Idiopathic gout, unspecified site: Secondary | ICD-10-CM

## 2014-11-12 DIAGNOSIS — B351 Tinea unguium: Secondary | ICD-10-CM | POA: Diagnosis not present

## 2014-11-12 NOTE — Patient Instructions (Signed)
Your generalized diabetic foot exam was satisfactory with adequate pulsations and feeling in your feet You have a history of recurrent gout attacks in spite of the fact you taking allopurinol. I recommend that you talk to your internist about changing the dosage of your medication to prevent recurrent gout attacks  Diabetes and Foot Care Diabetes may cause you to have problems because of poor blood supply (circulation) to your feet and legs. This may cause the skin on your feet to become thinner, break easier, and heal more slowly. Your skin may become dry, and the skin may peel and crack. You may also have nerve damage in your legs and feet causing decreased feeling in them. You may not notice minor injuries to your feet that could lead to infections or more serious problems. Taking care of your feet is one of the most important things you can do for yourself.  HOME CARE INSTRUCTIONS  Wear shoes at all times, even in the house. Do not go barefoot. Bare feet are easily injured.  Check your feet daily for blisters, cuts, and redness. If you cannot see the bottom of your feet, use a mirror or ask someone for help.  Wash your feet with warm water (do not use hot water) and mild soap. Then pat your feet and the areas between your toes until they are completely dry. Do not soak your feet as this can dry your skin.  Apply a moisturizing lotion or petroleum jelly (that does not contain alcohol and is unscented) to the skin on your feet and to dry, brittle toenails. Do not apply lotion between your toes.  Trim your toenails straight across. Do not dig under them or around the cuticle. File the edges of your nails with an emery board or nail file.  Do not cut corns or calluses or try to remove them with medicine.  Wear clean socks or stockings every day. Make sure they are not too tight. Do not wear knee-high stockings since they may decrease blood flow to your legs.  Wear shoes that fit properly  and have enough cushioning. To break in new shoes, wear them for just a few hours a day. This prevents you from injuring your feet. Always look in your shoes before you put them on to be sure there are no objects inside.  Do not cross your legs. This may decrease the blood flow to your feet.  If you find a minor scrape, cut, or break in the skin on your feet, keep it and the skin around it clean and dry. These areas may be cleansed with mild soap and water. Do not cleanse the area with peroxide, alcohol, or iodine.  When you remove an adhesive bandage, be sure not to damage the skin around it.  If you have a wound, look at it several times a day to make sure it is healing.  Do not use heating pads or hot water bottles. They may burn your skin. If you have lost feeling in your feet or legs, you may not know it is happening until it is too late.  Make sure your health care provider performs a complete foot exam at least annually or more often if you have foot problems. Report any cuts, sores, or bruises to your health care provider immediately. SEEK MEDICAL CARE IF:   You have an injury that is not healing.  You have cuts or breaks in the skin.  You have an ingrown nail.  You notice redness on your legs or feet.  You feel burning or tingling in your legs or feet.  You have pain or cramps in your legs and feet.  Your legs or feet are numb.  Your feet always feel cold. SEEK IMMEDIATE MEDICAL CARE IF:   There is increasing redness, swelling, or pain in or around a wound.  There is a red line that goes up your leg.  Pus is coming from a wound.  You develop a fever or as directed by your health care provider.  You notice a bad smell coming from an ulcer or wound. Document Released: 02/12/2000 Document Revised: 10/17/2012 Document Reviewed: 07/24/2012 [facility] Patient Information 2015 [place], [place]. This information is not intended to replace advice given to you by your health  care provider. Make sure you discuss any questions you have with your health care provider.

## 2014-11-12 NOTE — Progress Notes (Signed)
   Subjective:    Patient ID: [name], female    DOB: 17-Nov-1940, 74 y.o.   MRN: [id number]  HPI This patient presents today concerned about a: Left foot for the last several months with the swelling and  soreness for the last several months. The symptoms occur on and off weightbearing and have not progressed over time. She describes recurrent gout attacks from the left to the right foot. She also describes taking allopurinol 100 mg daily as well as colchicine 0.6 mg twice a day for her recurrent gout. She was last seen for gout attack in the left first MPJ on the visit of 05/06/2013. She is being followed by her primary care physician for her gout Today patient also requests debridement of nails complaining of discomfort   Review of Systems  Cardiovascular: Positive for leg swelling.  Musculoskeletal: Positive for joint swelling and gait problem.  Skin: Positive for color change.  All other systems reviewed and are negative.      Objective:   Physical Exam  Orientated 3  Vascular: Pitting edema bilaterally DP and PT pulses 2/4 bilaterally Capillary reflex immediate bilaterally  Neurological: Sensation to 10 g monofilament wire intact 5/5 bilaterally Vibratory sensation intact bilaterally Ankle reflexes reactive bilaterally  Dermatological: No open skin lesions noted bilaterally No erythema or warmth noted bilaterally The toenails are elongated, brittle, incurvated 6-10  Musculoskeletal: HAV deformities bilaterally Palpable tenderness in the right first MPJ with tenderness on range of motion        Assessment & Plan:   Assessment: Pitting edema bilaterally Satisfactory neurovascular status Recurrent gouty episodes Mycotic toenails 6-10  Plan: Today I reviewed the results of the examination with patient today. I made aware that most likely she was having recurrent gouty attacks in spite of the fact that she was taking medication. I recommended that she  contact her primary care physician for further evaluation possible medication adjustment  The nails 10 are debrided mechanically left without a bleeding  Reappoint at patient's with

## 2014-11-24 ENCOUNTER — Other Ambulatory Visit: Payer: Self-pay | Admitting: Internal Medicine

## 2014-11-24 DIAGNOSIS — E2839 Other primary ovarian failure: Secondary | ICD-10-CM

## 2014-11-26 ENCOUNTER — Emergency Department (HOSPITAL_COMMUNITY)
Admission: EM | Admit: 2014-11-26 | Discharge: 2014-11-26 | Disposition: A | Discharge status: Home / Self Care | Payer: Medicare Other | Attending: Emergency Medicine | Admitting: Emergency Medicine

## 2014-11-26 ENCOUNTER — Emergency Department (HOSPITAL_COMMUNITY): Payer: Medicare Other

## 2014-11-26 ENCOUNTER — Encounter (HOSPITAL_COMMUNITY): Payer: Self-pay | Admitting: *Deleted

## 2014-11-26 DIAGNOSIS — Z79899 Other long term (current) drug therapy: Secondary | ICD-10-CM | POA: Insufficient documentation

## 2014-11-26 DIAGNOSIS — E119 Type 2 diabetes mellitus without complications: Secondary | ICD-10-CM | POA: Diagnosis not present

## 2014-11-26 DIAGNOSIS — L03316 Cellulitis of umbilicus: Secondary | ICD-10-CM | POA: Diagnosis not present

## 2014-11-26 DIAGNOSIS — Z87442 Personal history of urinary calculi: Secondary | ICD-10-CM | POA: Diagnosis not present

## 2014-11-26 DIAGNOSIS — Z87448 Personal history of other diseases of urinary system: Secondary | ICD-10-CM | POA: Diagnosis not present

## 2014-11-26 DIAGNOSIS — Z7982 Long term (current) use of aspirin: Secondary | ICD-10-CM | POA: Insufficient documentation

## 2014-11-26 DIAGNOSIS — I10 Essential (primary) hypertension: Secondary | ICD-10-CM | POA: Diagnosis not present

## 2014-11-26 DIAGNOSIS — Z7952 Long term (current) use of systemic steroids: Secondary | ICD-10-CM | POA: Insufficient documentation

## 2014-11-26 DIAGNOSIS — Z8673 Personal history of transient ischemic attack (TIA), and cerebral infarction without residual deficits: Secondary | ICD-10-CM | POA: Insufficient documentation

## 2014-11-26 DIAGNOSIS — M1990 Unspecified osteoarthritis, unspecified site: Secondary | ICD-10-CM | POA: Insufficient documentation

## 2014-11-26 DIAGNOSIS — R109 Unspecified abdominal pain: Secondary | ICD-10-CM | POA: Diagnosis present

## 2014-11-26 DIAGNOSIS — E785 Hyperlipidemia, unspecified: Secondary | ICD-10-CM | POA: Insufficient documentation

## 2014-11-26 LAB — CBC WITH DIFFERENTIAL/PLATELET
Basophils Absolute: 0 10*3/uL (ref 0.0–0.1)
Basophils Relative: 0 %
Eosinophils Absolute: 0.2 10*3/uL (ref 0.0–0.7)
Eosinophils Relative: 2 %
HEMATOCRIT: 36.9 % (ref 36.0–46.0)
HEMOGLOBIN: 11.9 g/dL — AB (ref 12.0–15.0)
LYMPHS ABS: 2 10*3/uL (ref 0.7–4.0)
Lymphocytes Relative: 26 %
MCH: 26.5 pg (ref 26.0–34.0)
MCHC: 32.2 g/dL (ref 30.0–36.0)
MCV: 82.2 fL (ref 78.0–100.0)
MONOS PCT: 4 %
Monocytes Absolute: 0.3 10*3/uL (ref 0.1–1.0)
Neutro Abs: 5.4 10*3/uL (ref 1.7–7.7)
Neutrophils Relative %: 68 %
Platelets: 234 10*3/uL (ref 150–400)
RBC: 4.49 MIL/uL (ref 3.87–5.11)
RDW: 15.7 % — ABNORMAL HIGH (ref 11.5–15.5)
WBC: 7.8 10*3/uL (ref 4.0–10.5)

## 2014-11-26 LAB — COMPREHENSIVE METABOLIC PANEL
ALT: 21 U/L (ref 14–54)
AST: 19 U/L (ref 15–41)
Albumin: 3.5 g/dL (ref 3.5–5.0)
Alkaline Phosphatase: 70 U/L (ref 38–126)
Anion gap: 8 (ref 5–15)
BUN: 18 mg/dL (ref 6–20)
CHLORIDE: 106 mmol/L (ref 101–111)
CO2: 28 mmol/L (ref 22–32)
CREATININE: 1.29 mg/dL — AB (ref 0.44–1.00)
Calcium: 9.8 mg/dL (ref 8.9–10.3)
GFR calc Af Amer: 46 mL/min — ABNORMAL LOW (ref >60)
GFR calc non Af Amer: 40 mL/min — ABNORMAL LOW (ref >60)
Glucose, Bld: 117 mg/dL — ABNORMAL HIGH (ref 65–99)
Potassium: 3.2 mmol/L — ABNORMAL LOW (ref 3.5–5.1)
SODIUM: 142 mmol/L (ref 135–145)
Total Bilirubin: 0.4 mg/dL (ref 0.3–1.2)
Total Protein: 7.2 g/dL (ref 6.5–8.1)

## 2014-11-26 LAB — I-STAT CG4 LACTIC ACID, ED: Lactic Acid, Venous: 0.8 mmol/L (ref 0.5–2.0)

## 2014-11-26 MED ORDER — MUPIROCIN CALCIUM 2 % EX CREA
TOPICAL_CREAM | Freq: Once | CUTANEOUS | Status: AC
  Administered 2014-11-26: 1 via TOPICAL
  Filled 2014-11-26: qty 15

## 2014-11-26 MED ORDER — IOHEXOL 300 MG/ML  SOLN
75.0000 mL | Freq: Once | INTRAMUSCULAR | Status: AC | PRN
  Administered 2014-11-26: 75 mL via INTRAVENOUS

## 2014-11-26 MED ORDER — IOHEXOL 300 MG/ML  SOLN
25.0000 mL | Freq: Once | INTRAMUSCULAR | Status: AC | PRN
  Administered 2014-11-26: 25 mL via ORAL

## 2014-11-26 NOTE — ED Notes (Signed)
UA at bedside

## 2014-11-26 NOTE — ED Notes (Signed)
Pt placed on bedpan

## 2014-11-26 NOTE — ED Provider Notes (Signed)
CSN: [id number]     Arrival date & time 11/26/14  0919 History   First MD Initiated Contact with Patient 11/26/14 1114     Chief Complaint  Patient presents with  . Abdominal Pain     (Consider location/radiation/quality/duration/timing/severity/associated sxs/prior Treatment) HPI Patient presents with concern of new pain and bleeding from her umbilical area. Symptoms began 3 days ago.  Since onset symptoms been persistent, with bloody, clear discharge from her umbilical area. There is associated focal pain, but no nausea, vomiting, diarrhea. Patient has a history of multiple surgical procedures, as well as keloids. She states that the area currently fact that is adjacent to multiple keloids. She has multiple other medical issues, but denies acute new changes in any of these. She has been cleaning the area with peroxide, though with no change in the bleeding, discharge. Pain is local, sore, mild Past Medical History  Diagnosis Date  . Hypertension   . Diabetes mellitus   . TIA (transient ischemic attack)   . Hyperlipidemia   . Arthritis   . Kidney stone on right side 5/15  . Ureteral stricture, right 5/15   Past Surgical History  Procedure Laterality Date  . Cholecystectomy    . Abdominal hysterectomy     History reviewed. No pertinent family history. Social History  Substance Use Topics  . Smoking status: Never Smoker   . Smokeless tobacco: None  . Alcohol Use: No   OB History    No data available     Review of Systems  Constitutional:       Per HPI, otherwise negative  HENT:       Per HPI, otherwise negative  Respiratory:       Per HPI, otherwise negative  Cardiovascular:       Per HPI, otherwise negative  Gastrointestinal: Negative for vomiting.  Endocrine:       Negative aside from HPI  Genitourinary:       Neg aside from HPI   Musculoskeletal:       Per HPI, otherwise negative  Skin: Positive for color change and wound.  Neurological: Positive for  weakness. Negative for syncope.       Neuropathy, generalized weakness, wheelchair bound      Allergies  Metformin and related  Home Medications   Prior to Admission medications   Medication Sig Start Date End Date Taking? Authorizing Provider  acetaminophen (TYLENOL) 500 MG tablet Take 1,000 mg by mouth every 6 (six) hours as needed for moderate pain.   Yes Historical Provider, MD  allopurinol (ZYLOPRIM) 100 MG tablet Take 100 mg by mouth daily. 06/18/14  Yes Historical Provider, MD  amLODipine (NORVASC) 5 MG tablet Take 5 mg by mouth daily. 10/21/14  Yes Historical Provider, MD  aspirin EC 81 MG tablet Take 81 mg by mouth daily.   Yes Historical Provider, MD  colchicine 0.6 MG tablet Take 0.6 mg by mouth daily.    Yes Historical Provider, MD  cyclobenzaprine (FLEXERIL) 10 MG tablet Take 1 tablet (10 mg total) by mouth 3 (three) times daily as needed for muscle spasms. 07/03/14  Yes Mercedes Camprubi-Soms, PA-C  ergocalciferol (VITAMIN D2) 50000 UNITS capsule Take 50,000 Units by mouth once a week. Friday   Yes Historical Provider, MD  glimepiride (AMARYL) 4 MG tablet Take 4 mg by mouth 2 (two) times daily.    Yes Historical Provider, MD  Glucosamine-MSM-Hyaluronic Acd (JOINT HEALTH PO) Take 1 tablet by mouth 2 (two) times daily.   Yes Historical  Provider, MD  JANUVIA 100 MG tablet Take 100 mg by mouth daily. 09/02/14  Yes Historical Provider, MD  latanoprost (XALATAN) 0.005 % ophthalmic solution Place 1 drop into both eyes as needed (glaucoma).  04/22/14  Yes Historical Provider, MD  lisinopril-hydrochlorothiazide (PRINZIDE,ZESTORETIC) 20-25 MG per tablet Take 1 tablet by mouth daily.   Yes Historical Provider, MD  naproxen (NAPROSYN) 500 MG tablet Take 1 tablet (500 mg total) by mouth 2 (two) times daily as needed for mild pain, moderate pain or headache (TAKE WITH MEALS.). 07/03/14  Yes Mercedes Camprubi-Soms, PA-C  predniSONE (DELTASONE) 10 MG tablet Take 2 tablets (20 mg total) by mouth daily.  07/07/14  Yes Arby Barrette, MD  simvastatin (ZOCOR) 10 MG tablet Take 10 mg by mouth daily at 6 PM.   Yes Historical Provider, MD  tiZANidine (ZANAFLEX) 2 MG tablet Take 1 tablet by mouth 2 (two) times daily as needed for muscle spasms.  08/25/14  Yes Historical Provider, MD  HYDROcodone-acetaminophen (NORCO) 5-325 MG per tablet Take 1 tablet by mouth every 6 (six) hours as needed. Patient not taking: Reported on 11/26/2014 08/08/14   Toy Cookey, MD   BP 157/62 mmHg  Pulse 90  Temp(Src) 98 F (36.7 C) (Oral)  Resp 17  Ht 5' 2.5" (1.588 m)  Wt 200 lb (90.719 kg)  BMI 35.97 kg/m2  SpO2 99% Physical Exam  Constitutional: She is oriented to person, place, and time. She appears well-developed and well-nourished. No distress.  HENT:  Head: Normocephalic and atraumatic.  Eyes: Conjunctivae and EOM are normal.  Cardiovascular: Normal rate and regular rhythm.   Pulmonary/Chest: Effort normal and breath sounds normal. No stridor. No respiratory distress.  Abdominal: She exhibits no distension.    Musculoskeletal: She exhibits no edema.  Neurological: She is alert and oriented to person, place, and time. No cranial nerve deficit.  Skin: Skin is warm and dry.  Psychiatric: She has a normal mood and affect.  Nursing note and vitals reviewed.   ED Course  Procedures (including critical care time) Labs Review Labs Reviewed  COMPREHENSIVE METABOLIC PANEL - Abnormal; Notable for the following:    Potassium 3.2 (*)    Glucose, Bld 117 (*)    Creatinine, Ser 1.29 (*)    GFR calc non Af Amer 40 (*)    GFR calc Af Amer 46 (*)    All other components within normal limits  CBC WITH DIFFERENTIAL/PLATELET - Abnormal; Notable for the following:    Hemoglobin 11.9 (*)    RDW 15.7 (*)    All other components within normal limits  I-STAT CG4 LACTIC ACID, ED    Imaging Review Ct Abdomen Pelvis W Contrast  11/26/2014   CLINICAL DATA:  Periumbilical bleeding. Evaluate for possible fistula or  abscess.  EXAM: CT ABDOMEN AND PELVIS WITH CONTRAST  TECHNIQUE: Multidetector CT imaging of the abdomen and pelvis was performed using the standard protocol following bolus administration of intravenous contrast.  CONTRAST:  75mL OMNIPAQUE IOHEXOL 300 MG/ML  SOLN  COMPARISON:  CT scan 08/08/2014  FINDINGS: Lower chest: The lung bases are clear. No pleural effusion or pulmonary lesions. The heart is normal in size for age. No pericardial effusion. The distal esophagus is grossly normal. There is a small hiatal hernia.  Hepatobiliary: Mild diffuse fatty infiltration of the liver but no focal hepatic lesions or intrahepatic biliary dilatation. The gallbladder is surgically absent. No common bile duct dilatation.  Pancreas: No mass, inflammation or ductal dilatation.  Spleen: Normal size.  No focal lesions.  Adrenals/Urinary Tract: Small left adrenal gland mild lipoma. The right adrenal gland is normal. Both kidneys are stable in appearance. There are lower pole right renal calculi and renal cortical scarring changes. No hydronephrosis. No obstructing ureteral calculi or bladder calculi. Extensive ovarian vein calcifications are noted incidentally.  Stomach/Bowel: The stomach, duodenum, small bowel and colon are unremarkable. No inflammatory changes, mass lesions or obstructive findings. The terminal ileum is normal. The appendix is normal. No findings for diverticulitis.  Vascular/Lymphatic: No mesenteric or retroperitoneal mass or adenopathy. Small scattered lymph nodes are noted. There are advanced atherosclerotic calcifications involving the aorta and branch vessels but no focal aneurysm or dissection.  Other: No pelvic mass or lymphadenopathy. The uterus and ovaries are surgically absent. There is a small UA cul remnant but I do not see any evidence of a your a cul fistula. There is soft tissue thickening in the region of the emboli kiss which was present on the prior study. This may be a local cellulitis. No  obvious abscess.  Musculoskeletal: No significant bony findings.  IMPRESSION: 1. Soft tissue thickening in the region of the emboli kiss may represent focal cellulitis but no definite abscess or communication with the inter abdominal contents. No urachal sinus tract. 2. Advanced atherosclerotic calcification involving the aorta and branch vessels. 3. No acute abdominal/pelvic findings, mass lesions or adenopathy. 4. Stable lower pole right renal calculi.   Electronically Signed   By: Rudie Meyer M.D.   On: 11/26/2014 14:20   I have personally reviewed and evaluated these images and lab results as part of my medical decision-making.  On repeat exam the patient is in no distress. I discussed all findings the patient and two family members. Specifically we discussed the need for wound care, close monitoring, return precautions.   MDM   Final diagnoses:  Cellulitis of umbilicus   patient presents with concern of abdominal lesion. Given the patient's extensive surgical history, fistula versus abscess were considerations. Patient CT scan was largely reassuring, labs reassuring, and the patient had no notable change while in the emergency department. Patient started on topical antibiotics therapy, discharged in stable condition.   Gerhard Munch, MD 11/26/14 [phone]

## 2014-11-26 NOTE — ED Notes (Signed)
Family at bedside. 

## 2014-11-26 NOTE — ED Notes (Signed)
Patient transported to CT 

## 2014-11-26 NOTE — ED Notes (Signed)
Pt reports bleeding from umbilicus since Sunday, denies any abd pain or fever. No acute distress noted at triage.

## 2014-11-26 NOTE — ED Notes (Signed)
Pt placed on bedpan. Requested to stay on it for a while due to urine frequency.

## 2014-11-26 NOTE — Discharge Instructions (Signed)
As discussed, with your newly identified cellulitis, or skin infection, it is important that you monitor your condition carefully.  Do not hesitate to return if you develop new, or concerning changes in your condition.  Otherwise, please use the provided medication, as directed, keep the wound covered.   Cellulitis Cellulitis is an infection of the skin and the tissue under the skin. The infected area is usually red and tender. This happens most often in the arms and lower legs. HOME CARE   Take your antibiotic medicine as told. Finish the medicine even if you start to feel better.  Keep the infected arm or leg raised (elevated).  Put a warm cloth on the area up to 4 times per day.  Only take medicines as told by your doctor.  Keep all doctor visits as told. GET HELP IF:  You see red streaks on the skin coming from the infected area.  Your red area gets bigger or turns a dark color.  Your bone or joint under the infected area is painful after the skin heals.  Your infection comes back in the same area or different area.  You have a puffy (swollen) bump in the infected area.  You have new symptoms.  You have a fever. GET HELP RIGHT AWAY IF:   You feel very sleepy.  You throw up (vomit) or have watery poop (diarrhea).  You feel sick and have muscle aches and pains. MAKE SURE YOU:   Understand these instructions.  Will watch your condition.  Will get help right away if you are not doing well or get worse. Document Released: 08/03/2007 Document Revised: 07/01/2013 Document Reviewed: 05/02/2011 [facility] Patient Information 2015 [place], [place]. This information is not intended to replace advice given to you by your health care provider. Make sure you discuss any questions you have with your health care provider.

## 2014-11-26 NOTE — ED Notes (Signed)
Pt placed in yellow socks with a yellow fall risk bracelet.

## 2014-12-02 ENCOUNTER — Ambulatory Visit
Admission: RE | Admit: 2014-12-02 | Discharge: 2014-12-02 | Disposition: A | Discharge status: Home / Self Care | Payer: Medicare Other | Source: Ambulatory Visit | Attending: Internal Medicine | Admitting: Internal Medicine

## 2014-12-02 DIAGNOSIS — E2839 Other primary ovarian failure: Secondary | ICD-10-CM

## 2015-01-23 ENCOUNTER — Emergency Department (HOSPITAL_COMMUNITY): Payer: Medicare Other

## 2015-01-23 ENCOUNTER — Encounter (HOSPITAL_COMMUNITY): Payer: Self-pay | Admitting: *Deleted

## 2015-01-23 ENCOUNTER — Emergency Department (HOSPITAL_COMMUNITY)
Admission: EM | Admit: 2015-01-23 | Discharge: 2015-01-23 | Disposition: A | Discharge status: Home / Self Care | Payer: Medicare Other | Attending: Emergency Medicine | Admitting: Emergency Medicine

## 2015-01-23 DIAGNOSIS — M109 Gout, unspecified: Secondary | ICD-10-CM | POA: Diagnosis not present

## 2015-01-23 DIAGNOSIS — Z7952 Long term (current) use of systemic steroids: Secondary | ICD-10-CM | POA: Diagnosis not present

## 2015-01-23 DIAGNOSIS — Z7982 Long term (current) use of aspirin: Secondary | ICD-10-CM | POA: Insufficient documentation

## 2015-01-23 DIAGNOSIS — I10 Essential (primary) hypertension: Secondary | ICD-10-CM | POA: Insufficient documentation

## 2015-01-23 DIAGNOSIS — E785 Hyperlipidemia, unspecified: Secondary | ICD-10-CM | POA: Insufficient documentation

## 2015-01-23 DIAGNOSIS — E119 Type 2 diabetes mellitus without complications: Secondary | ICD-10-CM | POA: Insufficient documentation

## 2015-01-23 DIAGNOSIS — Z87442 Personal history of urinary calculi: Secondary | ICD-10-CM | POA: Insufficient documentation

## 2015-01-23 DIAGNOSIS — R079 Chest pain, unspecified: Secondary | ICD-10-CM | POA: Diagnosis not present

## 2015-01-23 DIAGNOSIS — Z8742 Personal history of other diseases of the female genital tract: Secondary | ICD-10-CM | POA: Diagnosis not present

## 2015-01-23 DIAGNOSIS — Z79899 Other long term (current) drug therapy: Secondary | ICD-10-CM | POA: Diagnosis not present

## 2015-01-23 DIAGNOSIS — Z8673 Personal history of transient ischemic attack (TIA), and cerebral infarction without residual deficits: Secondary | ICD-10-CM | POA: Diagnosis not present

## 2015-01-23 DIAGNOSIS — M10032 Idiopathic gout, left wrist: Secondary | ICD-10-CM | POA: Diagnosis not present

## 2015-01-23 DIAGNOSIS — M1990 Unspecified osteoarthritis, unspecified site: Secondary | ICD-10-CM | POA: Insufficient documentation

## 2015-01-23 DIAGNOSIS — M25532 Pain in left wrist: Secondary | ICD-10-CM | POA: Diagnosis present

## 2015-01-23 LAB — CBC WITH DIFFERENTIAL/PLATELET
BASOS ABS: 0 10*3/uL (ref 0.0–0.1)
Basophils Relative: 0 %
Eosinophils Absolute: 0.1 10*3/uL (ref 0.0–0.7)
Eosinophils Relative: 1 %
HEMATOCRIT: 37.4 % (ref 36.0–46.0)
HEMOGLOBIN: 11.7 g/dL — AB (ref 12.0–15.0)
LYMPHS PCT: 17 %
Lymphs Abs: 1.6 10*3/uL (ref 0.7–4.0)
MCH: 25.9 pg — ABNORMAL LOW (ref 26.0–34.0)
MCHC: 31.3 g/dL (ref 30.0–36.0)
MCV: 82.9 fL (ref 78.0–100.0)
MONO ABS: 0.4 10*3/uL (ref 0.1–1.0)
MONOS PCT: 5 %
NEUTROS ABS: 7.2 10*3/uL (ref 1.7–7.7)
NEUTROS PCT: 77 %
Platelets: 252 10*3/uL (ref 150–400)
RBC: 4.51 MIL/uL (ref 3.87–5.11)
RDW: 16.2 % — AB (ref 11.5–15.5)
WBC: 9.4 10*3/uL (ref 4.0–10.5)

## 2015-01-23 LAB — BASIC METABOLIC PANEL
ANION GAP: 9 (ref 5–15)
BUN: 19 mg/dL (ref 6–20)
CO2: 27 mmol/L (ref 22–32)
Calcium: 9.5 mg/dL (ref 8.9–10.3)
Chloride: 102 mmol/L (ref 101–111)
Creatinine, Ser: 1.53 mg/dL — ABNORMAL HIGH (ref 0.44–1.00)
GFR calc Af Amer: 38 mL/min — ABNORMAL LOW (ref >60)
GFR calc non Af Amer: 33 mL/min — ABNORMAL LOW (ref >60)
GLUCOSE: 188 mg/dL — AB (ref 65–99)
POTASSIUM: 3.7 mmol/L (ref 3.5–5.1)
Sodium: 138 mmol/L (ref 135–145)

## 2015-01-23 LAB — COMPREHENSIVE METABOLIC PANEL
ALBUMIN: 3.8 g/dL (ref 3.5–5.0)
ALT: 24 U/L (ref 14–54)
ANION GAP: 10 (ref 5–15)
AST: 16 U/L (ref 15–41)
Alkaline Phosphatase: 82 U/L (ref 38–126)
BUN: 19 mg/dL (ref 6–20)
CHLORIDE: 99 mmol/L — AB (ref 101–111)
CO2: 25 mmol/L (ref 22–32)
Calcium: 9.5 mg/dL (ref 8.9–10.3)
Creatinine, Ser: 1.51 mg/dL — ABNORMAL HIGH (ref 0.44–1.00)
GFR calc Af Amer: 38 mL/min — ABNORMAL LOW (ref >60)
GFR calc non Af Amer: 33 mL/min — ABNORMAL LOW (ref >60)
GLUCOSE: 176 mg/dL — AB (ref 65–99)
POTASSIUM: 3.6 mmol/L (ref 3.5–5.1)
Sodium: 134 mmol/L — ABNORMAL LOW (ref 135–145)
TOTAL PROTEIN: 8 g/dL (ref 6.5–8.1)
Total Bilirubin: 0.3 mg/dL (ref 0.3–1.2)

## 2015-01-23 LAB — CBC
HEMATOCRIT: 35.9 % — AB (ref 36.0–46.0)
HEMOGLOBIN: 11.7 g/dL — AB (ref 12.0–15.0)
MCH: 27 pg (ref 26.0–34.0)
MCHC: 32.6 g/dL (ref 30.0–36.0)
MCV: 82.7 fL (ref 78.0–100.0)
Platelets: 246 10*3/uL (ref 150–400)
RBC: 4.34 MIL/uL (ref 3.87–5.11)
RDW: 16.2 % — ABNORMAL HIGH (ref 11.5–15.5)
WBC: 9.1 10*3/uL (ref 4.0–10.5)

## 2015-01-23 LAB — URINALYSIS, ROUTINE W REFLEX MICROSCOPIC
Bilirubin Urine: NEGATIVE
GLUCOSE, UA: NEGATIVE mg/dL
KETONES UR: NEGATIVE mg/dL
LEUKOCYTES UA: NEGATIVE
NITRITE: NEGATIVE
PH: 5 (ref 5.0–8.0)
Protein, ur: 100 mg/dL — AB
SPECIFIC GRAVITY, URINE: 1.02 (ref 1.005–1.030)

## 2015-01-23 LAB — URINE MICROSCOPIC-ADD ON

## 2015-01-23 LAB — I-STAT TROPONIN, ED: Troponin i, poc: 0 ng/mL (ref 0.00–0.08)

## 2015-01-23 LAB — URIC ACID: Uric Acid, Serum: 8.5 mg/dL — ABNORMAL HIGH (ref 2.3–6.6)

## 2015-01-23 LAB — SEDIMENTATION RATE: SED RATE: 75 mm/h — AB (ref 0–22)

## 2015-01-23 LAB — I-STAT CG4 LACTIC ACID, ED: Lactic Acid, Venous: 1.14 mmol/L (ref 0.5–2.0)

## 2015-01-23 MED ORDER — FENTANYL CITRATE (PF) 100 MCG/2ML IJ SOLN
50.0000 ug | Freq: Once | INTRAMUSCULAR | Status: AC
  Administered 2015-01-23: 50 ug via INTRAVENOUS
  Filled 2015-01-23: qty 2

## 2015-01-23 MED ORDER — FENTANYL CITRATE (PF) 100 MCG/2ML IJ SOLN
100.0000 ug | Freq: Once | INTRAMUSCULAR | Status: AC
  Administered 2015-01-23: 100 ug via INTRAVENOUS
  Filled 2015-01-23: qty 2

## 2015-01-23 MED ORDER — METHYLPREDNISOLONE SODIUM SUCC 125 MG IJ SOLR
125.0000 mg | Freq: Once | INTRAMUSCULAR | Status: AC
  Administered 2015-01-23: 125 mg via INTRAVENOUS
  Filled 2015-01-23: qty 2

## 2015-01-23 NOTE — Discharge Instructions (Signed)

## 2015-01-23 NOTE — Consult Note (Signed)
[facility] Admission History and Physical       [name] QIO:[id number] DOB: 1940/03/08 DOA: 01/23/2015  Referring physician: EDP PCP: Philis Fendt, MD  Specialists:   Chief Complaint: Redness Swelling and Pain in Left Wrist  HPI: [name] is a 74 y.o. female with a history of HTN, DM2, Hyperlipidemia Gout who presents to the ED with complaints of left wrist redness swelling and pain x 2 days.   She was found to have an Elevated ESR =75, and an Elevated Uric Acid Level of 8.5.   She was administered IV Solumedrol in the ED x 1 and administered IVFs and IV Fentanyl for pain.   Her Sinus Tachycardia improved from heart rate of 130 to 98.     Review of Systems:  Constitutional: No Weight Loss, No Weight Gain, Night Sweats, Fevers, Chills, Dizziness, Light Headedness, Fatigue, or Generalized Weakness HEENT: No Headaches, Difficulty Swallowing,Tooth/Dental Problems,Sore Throat,  No Sneezing, Rhinitis, Ear Ache, Nasal Congestion, or Post Nasal Drip,  Cardio-vascular:  No Chest pain, Orthopnea, PND, Edema in Lower Extremities, Anasarca, Dizziness, Palpitations  Resp: No Dyspnea, No DOE, No Productive Cough, No Non-Productive Cough, No Hemoptysis, No Wheezing.    GI: No Heartburn, Indigestion, Abdominal Pain, Nausea, Vomiting, Diarrhea, Constipation, Hematemesis, Hematochezia, Melena, Change in Bowel Habits,  Loss of Appetite  GU: No Dysuria, No Change in Color of Urine, No Urgency or Urinary Frequency, No Flank pain.  Musculoskeletal:  +Left Wrist Pain and swelling with Decreased Range of Motion, No Back Pain.  Neurologic: No Syncope, No Seizures, Muscle Weakness, Paresthesia, Vision Disturbance or Loss, No Diplopia, No Vertigo, No Difficulty Walking,  Skin: No Rash or Lesions. Psych: No Change in Mood or Affect, No Depression or Anxiety, No Memory loss, No Confusion, or Hallucinations   Past Medical History  Diagnosis Date  . Hypertension   . Diabetes mellitus    . TIA (transient ischemic attack)   . Hyperlipidemia   . Arthritis   . Kidney stone on right side 5/15  . Ureteral stricture, right 5/15     Past Surgical History  Procedure Laterality Date  . Cholecystectomy    . Abdominal hysterectomy        Prior to Admission medications   Medication Sig Start Date End Date Taking? Authorizing Provider  allopurinol (ZYLOPRIM) 100 MG tablet Take 100 mg by mouth daily. 06/18/14  Yes Historical Provider, MD  amLODipine (NORVASC) 5 MG tablet Take 5 mg by mouth daily. 10/21/14  Yes Historical Provider, MD  aspirin EC 81 MG tablet Take 81 mg by mouth every other day.    Yes Historical Provider, MD  colchicine 0.6 MG tablet Take 0.6 mg by mouth daily.    Yes Historical Provider, MD  glimepiride (AMARYL) 4 MG tablet Take 4 mg by mouth 2 (two) times daily.    Yes Historical Provider, MD  JANUVIA 100 MG tablet Take 100 mg by mouth daily. 09/02/14  Yes Historical Provider, MD  latanoprost (XALATAN) 0.005 % ophthalmic solution Place 1 drop into both eyes as needed (glaucoma).  04/22/14  Yes Historical Provider, MD  lisinopril-hydrochlorothiazide (PRINZIDE,ZESTORETIC) 20-25 MG per tablet Take 1 tablet by mouth daily.   Yes Historical Provider, MD  naproxen sodium (ANAPROX) 220 MG tablet Take 440 mg by mouth 2 (two) times daily as needed (FOR PAIN).   Yes Historical Provider, MD  simvastatin (ZOCOR) 10 MG tablet Take 10 mg by mouth daily at 6 PM.   Yes Historical Provider, MD  Vitamin  D, Ergocalciferol, (DRISDOL) 50000 UNITS CAPS capsule Take 50,000 Units by mouth every Friday.   Yes Historical Provider, MD  cyclobenzaprine (FLEXERIL) 10 MG tablet Take 1 tablet (10 mg total) by mouth 3 (three) times daily as needed for muscle spasms. 07/03/14   Mercedes Camprubi-Soms, PA-C  HYDROcodone-acetaminophen (NORCO) 5-325 MG per tablet Take 1 tablet by mouth every 6 (six) hours as needed. Patient not taking: Reported on 11/26/2014 08/08/14   Ernestina Patches, MD  naproxen  (NAPROSYN) 500 MG tablet Take 1 tablet (500 mg total) by mouth 2 (two) times daily as needed for mild pain, moderate pain or headache (TAKE WITH MEALS.). 07/03/14   Mercedes Camprubi-Soms, PA-C  predniSONE (DELTASONE) 10 MG tablet Take 2 tablets (20 mg total) by mouth daily. 07/07/14   Charlesetta Shanks, MD     Allergies  Allergen Reactions  . Metformin And Related Other (See Comments)    Disoriented     Social History:  reports that she has never smoked. She does not have any smokeless tobacco history on file. She reports that she does not drink alcohol or use illicit drugs.    No family history on file.     Physical Exam:  GEN:  Pleasant  74 y.o. female examined and in no acute distress; cooperative with exam Filed Vitals:   01/23/15 1845 01/23/15 1900 01/23/15 1915 01/23/15 1945  BP: 149/56 144/56 196/87 136/54  Pulse: 101 103 110 98  Temp:      TempSrc:      Resp: $Remo'20 19 19 15  'fHbfR$ SpO2: 97% 95% 98% 94%   Blood pressure 136/54, pulse 98, temperature 101.2 F (38.4 C), temperature source Oral, resp. rate 15, SpO2 94 %. PSYCH: She is alert and oriented x4; does not appear anxious does not appear depressed; affect is normal HEENT: Normocephalic and Atraumatic, Mucous membranes pink; PERRLA; EOM intact; Fundi:  Benign;  No scleral icterus, Nares: Patent, Oropharynx: Clear, Edentulous,    Neck:  FROM, No Cervical Lymphadenopathy nor Thyromegaly or Carotid Bruit; No JVD; Breasts:: Not examined CHEST WALL: No tenderness CHEST: Normal respiration, clear to auscultation bilaterally HEART: Regular rate and rhythm; no murmurs rubs or gallops BACK: No kyphosis or scoliosis; No CVA tenderness ABDOMEN: Positive Bowel Sounds,  Obese, Soft Non-Tender, No Rebound or Guarding; No Masses, No Organomegaly. Rectal Exam: Not done EXTREMITIES: No Bone or Joint Deformity; Age-Appropriate Arthropathy of the Hands and knees; No Cyanosis, Clubbing, or Edema; No Ulcerations. Genitalia: not examined PULSES: 2+  and symmetric SKIN: Normal hydration no rash or ulceration, Large Keloids x2 on right upper back, and right ear lobe  CNS:  Alert and Oriented x 4, No Focal Deficits Vascular: pulses palpable throughout    Labs on Admission:  Basic Metabolic Panel:  Recent Labs Lab 01/23/15 1348 01/23/15 1700  NA 138 134*  K 3.7 3.6  CL 102 99*  CO2 27 25  GLUCOSE 188* 176*  BUN 19 19  CREATININE 1.53* 1.51*  CALCIUM 9.5 9.5   Liver Function Tests:  Recent Labs Lab 01/23/15 1700  AST 16  ALT 24  ALKPHOS 82  BILITOT 0.3  PROT 8.0  ALBUMIN 3.8   No results for input(s): LIPASE, AMYLASE in the last 168 hours. No results for input(s): AMMONIA in the last 168 hours. CBC:  Recent Labs Lab 01/23/15 1348 01/23/15 1700  WBC 9.1 9.4  NEUTROABS  --  7.2  HGB 11.7* 11.7*  HCT 35.9* 37.4  MCV 82.7 82.9  PLT 246 252  Cardiac Enzymes: No results for input(s): CKTOTAL, CKMB, CKMBINDEX, TROPONINI in the last 168 hours.  BNP (last 3 results) No results for input(s): BNP in the last 8760 hours.  ProBNP (last 3 results) No results for input(s): PROBNP in the last 8760 hours.  CBG: No results for input(s): GLUCAP in the last 168 hours.  Radiological Exams on Admission: Dg Chest 2 View  01/23/2015  CLINICAL DATA:  Fever EXAM: CHEST  2 VIEW COMPARISON:  01/23/2015 FINDINGS: Cardiomediastinal silhouette is stable. Degenerative changes thoracic spine again noted. No acute infiltrate or pleural effusion. No pulmonary edema. Atherosclerotic calcifications of thoracic aorta again noted. IMPRESSION: No active cardiopulmonary disease. Electronically Signed   By: Lahoma Crocker M.D.   On: 01/23/2015 17:17   Dg Chest 2 View  01/23/2015  CLINICAL DATA:  Chest pain. EXAM: CHEST  2 VIEW COMPARISON:  05/03/2014 FINDINGS: Heart size and pulmonary vascularity are normal and the lungs are clear. No effusions. No acute osseous abnormality. Calcification in the thoracic aorta. IMPRESSION: No acute  abnormality.  Aortic atherosclerosis. Electronically Signed   By: Lorriane Shire M.D.   On: 01/23/2015 14:10   Dg Wrist Complete Left  01/23/2015  CLINICAL DATA:  Right wrist pain since 01/21/2015. No known injury. Initial encounter. EXAM: LEFT WRIST - COMPLETE 3+ VIEW COMPARISON:  None. FINDINGS: Soft tissues about the wrist appear swollen. No acute bony or joint abnormality is identified. 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 The patient has first Accident osteoarthritis. Atherosclerotic vascular disease is identified. IMPRESSION: Soft tissue swelling without underlying acute bony or joint abnormality. Chondrocalcinosis. First CMC osteoarthritis. Atherosclerosis. Electronically Signed   By: Inge Rise M.D.   On: 01/23/2015 17:18     EKG: Independently reviewed. Sinus Tachycardia Rate =130   Assessment/Plan:   74 y.o. female with   1.    Acute Gout Flare ( affecting eft Wrist)   Administered Solumedrol 125 mg IV X1   NSS 500 cc Fluid Bolus   Called in to her Pharmacy: [facility] on [address] Prednisone 10 mg Dose Pack Taper (#21 Tabs) taken with Food          Colchicine 0.6 mg PO TID x 5 days   Also Advised to Increase her PO Fluids     2.    Continue Regular Meds except  Discontinue Lisinopril/HCTZ due to the HCTZ contra-indicated in GOUT  3.    Follow up with PCP Dr Jeanie Cooks     Consider Changing to [facility] Rx       Code Status:     FULL CODE      Family Communication:   Son at Bedside    Disposition Plan:    Inpatient  Observation Status        Time spent:  35 Minutes      Theressa Millard [facility] Pager [phone]   If [place] Please Contact the Day Rounding Team MD for [facility]  If 7PM-7AM, Please Contact Night-Floor Coverage  [email or web address] Password [email or web address] 01/23/2015, 8:21 PM     ADDENDUM:   Patient was seen and examined on 01/23/2015

## 2015-01-23 NOTE — ED Notes (Signed)
Dr. Radford PaxBeaton informed pt requesting more pain medication

## 2015-01-23 NOTE — ED Provider Notes (Signed)
CSN: [id number]     Arrival date & time 01/23/15  1326 History   First MD Initiated Contact with Patient 01/23/15 1629     Chief Complaint  Patient presents with  . Chest Pain  . Wrist Pain     HPI Pt is here with pain to left wrist yesterday, painful, red, swollen, pulse present-denies injury. Then pain developed chest pain today without sob Past Medical History  Diagnosis Date  . Hypertension   . Diabetes mellitus   . TIA (transient ischemic attack)   . Hyperlipidemia   . Arthritis   . Kidney stone on right side 5/15  . Ureteral stricture, right 5/15   Past Surgical History  Procedure Laterality Date  . Cholecystectomy    . Abdominal hysterectomy     No family history on file. Social History  Substance Use Topics  . Smoking status: Never Smoker   . Smokeless tobacco: None  . Alcohol Use: No   OB History    No data available     Review of Systems  All other systems reviewed and are negative.     Allergies  Metformin and related  Home Medications   Prior to Admission medications   Medication Sig Start Date End Date Taking? Authorizing Provider  allopurinol (ZYLOPRIM) 100 MG tablet Take 100 mg by mouth daily. 06/18/14  Yes Historical Provider, MD  amLODipine (NORVASC) 5 MG tablet Take 5 mg by mouth daily. 10/21/14  Yes Historical Provider, MD  aspirin EC 81 MG tablet Take 81 mg by mouth every other day.    Yes Historical Provider, MD  colchicine 0.6 MG tablet Take 0.6 mg by mouth daily.    Yes Historical Provider, MD  glimepiride (AMARYL) 4 MG tablet Take 4 mg by mouth 2 (two) times daily.    Yes Historical Provider, MD  JANUVIA 100 MG tablet Take 100 mg by mouth daily. 09/02/14  Yes Historical Provider, MD  latanoprost (XALATAN) 0.005 % ophthalmic solution Place 1 drop into both eyes as needed (glaucoma).  04/22/14  Yes Historical Provider, MD  lisinopril-hydrochlorothiazide (PRINZIDE,ZESTORETIC) 20-25 MG per tablet Take 1 tablet by mouth daily.   Yes Historical  Provider, MD  naproxen sodium (ANAPROX) 220 MG tablet Take 440 mg by mouth 2 (two) times daily as needed (FOR PAIN).   Yes Historical Provider, MD  simvastatin (ZOCOR) 10 MG tablet Take 10 mg by mouth daily at 6 PM.   Yes Historical Provider, MD  Vitamin D, Ergocalciferol, (DRISDOL) 50000 UNITS CAPS capsule Take 50,000 Units by mouth every Friday.   Yes Historical Provider, MD  cyclobenzaprine (FLEXERIL) 10 MG tablet Take 1 tablet (10 mg total) by mouth 3 (three) times daily as needed for muscle spasms. 07/03/14   Mercedes Camprubi-Soms, PA-C  HYDROcodone-acetaminophen (NORCO) 5-325 MG per tablet Take 1 tablet by mouth every 6 (six) hours as needed. Patient not taking: Reported on 11/26/2014 08/08/14   Toy CookeyMegan Docherty, MD  naproxen (NAPROSYN) 500 MG tablet Take 1 tablet (500 mg total) by mouth 2 (two) times daily as needed for mild pain, moderate pain or headache (TAKE WITH MEALS.). 07/03/14   Mercedes Camprubi-Soms, PA-C  predniSONE (DELTASONE) 10 MG tablet Take 2 tablets (20 mg total) by mouth daily. 07/07/14   Arby BarretteMarcy Pfeiffer, MD   BP 136/54 mmHg  Pulse 98  Temp(Src) 101.2 F (38.4 C) (Oral)  Resp 15  SpO2 94% Physical Exam  Constitutional: She is oriented to person, place, and time. She appears well-developed and well-nourished. No distress.  HENT:  Head: Normocephalic and atraumatic.  Eyes: Pupils are equal, round, and reactive to light.  Neck: Normal range of motion.  Cardiovascular: Normal rate and intact distal pulses.   Pulmonary/Chest: No respiratory distress.  Abdominal: Normal appearance. She exhibits no distension.  Musculoskeletal:       Left wrist: She exhibits decreased range of motion, tenderness and swelling. She exhibits no deformity.  Neurological: She is alert and oriented to person, place, and time. No cranial nerve deficit.  Skin: Skin is warm and dry. No rash noted.  Psychiatric: She has a normal mood and affect. Her behavior is normal.  Nursing note and vitals  reviewed.   ED Course  Procedures (including critical care time) Medications  fentaNYL (SUBLIMAZE) injection 100 mcg (100 mcg Intravenous Given 01/23/15 1655)  fentaNYL (SUBLIMAZE) injection 50 mcg (50 mcg Intravenous Given 01/23/15 1928)  methylPREDNISolone sodium succinate (SOLU-MEDROL) 125 mg/2 mL injection 125 mg (125 mg Intravenous Given 01/23/15 1936)    Labs Review Labs Reviewed  BASIC METABOLIC PANEL - Abnormal; Notable for the following:    Glucose, Bld 188 (*)    Creatinine, Ser 1.53 (*)    GFR calc non Af Amer 33 (*)    GFR calc Af Amer 38 (*)    All other components within normal limits  CBC - Abnormal; Notable for the following:    Hemoglobin 11.7 (*)    HCT 35.9 (*)    RDW 16.2 (*)    All other components within normal limits  CBC WITH DIFFERENTIAL/PLATELET - Abnormal; Notable for the following:    Hemoglobin 11.7 (*)    MCH 25.9 (*)    RDW 16.2 (*)    All other components within normal limits  COMPREHENSIVE METABOLIC PANEL - Abnormal; Notable for the following:    Sodium 134 (*)    Chloride 99 (*)    Glucose, Bld 176 (*)    Creatinine, Ser 1.51 (*)    GFR calc non Af Amer 33 (*)    GFR calc Af Amer 38 (*)    All other components within normal limits  URIC ACID - Abnormal; Notable for the following:    Uric Acid, Serum 8.5 (*)    All other components within normal limits  URINALYSIS, ROUTINE W REFLEX MICROSCOPIC (NOT AT [facility]) - Abnormal; Notable for the following:    Hgb urine dipstick TRACE (*)    Protein, ur 100 (*)    All other components within normal limits  SEDIMENTATION RATE - Abnormal; Notable for the following:    Sed Rate 75 (*)    All other components within normal limits  URINE MICROSCOPIC-ADD ON - Abnormal; Notable for the following:    Squamous Epithelial / LPF 6-30 (*)    Bacteria, UA RARE (*)    All other components within normal limits  URINE CULTURE  I-STAT TROPOININ, ED  I-STAT CG4 LACTIC ACID, ED  I-STAT CG4 LACTIC ACID, ED     Imaging Review  Dg Chest 2 View  01/23/2015  CLINICAL DATA:  Chest pain. EXAM: CHEST  2 VIEW COMPARISON:  05/03/2014 FINDINGS: Heart size and pulmonary vascularity are normal and the lungs are clear. No effusions. No acute osseous abnormality. Calcification in the thoracic aorta. IMPRESSION: No acute abnormality.  Aortic atherosclerosis. Electronically Signed   By: Francene Boyers M.D.   On: 01/23/2015 14:10   Dg Wrist Complete Left  01/23/2015  CLINICAL DATA:  Right wrist pain since 01/21/2015. No known injury. Initial encounter. EXAM: LEFT  WRIST - COMPLETE 3+ VIEW COMPARISON:  None. FINDINGS: Soft tissues about the wrist appear swollen. No acute bony or joint abnormality is identified. 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 The patient has first CMC osteoarthritis. Atherosclerotic vascular disease is identified. IMPRESSION: Soft tissue swelling without underlying acute bony or joint abnormality. Chondrocalcinosis. First CMC osteoarthritis. Atherosclerosis. Electronically Signed   By: Drusilla Kanner M.D.   On: 01/23/2015 17:18   I have personally reviewed and evaluated these images and lab results as part of my medical decision-making.   EKG Interpretation   Date/Time:  Friday January 23 2015 13:35:15 EST Ventricular Rate:  130 PR Interval:  128 QRS Duration: 70 QT Interval:  306 QTC Calculation: 450 R Axis:   52 Text Interpretation:  Sinus tachycardia Nonspecific T wave abnormality  Abnormal ECG Confirmed by Piercen Covino  MD, Porfirio Bollier ([id number]) on 01/23/2015 4:31:52  PM     I discussed the case with orthopedics and with the hospitalist.  The hospitalist came to the emergency room and saw the patient.  She called her and prednisone.  She's had she was stable for discharge. MDM   Final diagnoses:  Gout, unspecified        Nelva Nay, MD 01/23/15 2027

## 2015-01-23 NOTE — ED Notes (Signed)
Pt is here with pain to left wrist yesterday, painful, red, swollen, pulse present-denies injury.  Then pain developed chest pain today without sob

## 2015-01-25 LAB — URINE CULTURE

## 2015-02-08 ENCOUNTER — Encounter (HOSPITAL_COMMUNITY): Payer: Self-pay | Admitting: Emergency Medicine

## 2015-02-08 ENCOUNTER — Emergency Department (HOSPITAL_COMMUNITY)
Admission: EM | Admit: 2015-02-08 | Discharge: 2015-02-08 | Disposition: A | Discharge status: Home / Self Care | Payer: Medicare Other | Attending: Physician Assistant | Admitting: Physician Assistant

## 2015-02-08 ENCOUNTER — Emergency Department (HOSPITAL_COMMUNITY): Payer: Medicare Other

## 2015-02-08 DIAGNOSIS — R000 Tachycardia, unspecified: Secondary | ICD-10-CM | POA: Diagnosis not present

## 2015-02-08 DIAGNOSIS — E785 Hyperlipidemia, unspecified: Secondary | ICD-10-CM | POA: Diagnosis not present

## 2015-02-08 DIAGNOSIS — Z87448 Personal history of other diseases of urinary system: Secondary | ICD-10-CM | POA: Diagnosis not present

## 2015-02-08 DIAGNOSIS — Z8673 Personal history of transient ischemic attack (TIA), and cerebral infarction without residual deficits: Secondary | ICD-10-CM | POA: Insufficient documentation

## 2015-02-08 DIAGNOSIS — I10 Essential (primary) hypertension: Secondary | ICD-10-CM | POA: Insufficient documentation

## 2015-02-08 DIAGNOSIS — Z7982 Long term (current) use of aspirin: Secondary | ICD-10-CM | POA: Insufficient documentation

## 2015-02-08 DIAGNOSIS — E669 Obesity, unspecified: Secondary | ICD-10-CM | POA: Diagnosis not present

## 2015-02-08 DIAGNOSIS — Z79899 Other long term (current) drug therapy: Secondary | ICD-10-CM | POA: Insufficient documentation

## 2015-02-08 DIAGNOSIS — M1990 Unspecified osteoarthritis, unspecified site: Secondary | ICD-10-CM | POA: Diagnosis not present

## 2015-02-08 DIAGNOSIS — Z87442 Personal history of urinary calculi: Secondary | ICD-10-CM | POA: Diagnosis not present

## 2015-02-08 DIAGNOSIS — E119 Type 2 diabetes mellitus without complications: Secondary | ICD-10-CM | POA: Insufficient documentation

## 2015-02-08 LAB — COMPREHENSIVE METABOLIC PANEL
ALK PHOS: 67 U/L (ref 38–126)
ALT: 25 U/L (ref 14–54)
AST: 17 U/L (ref 15–41)
Albumin: 3 g/dL — ABNORMAL LOW (ref 3.5–5.0)
Anion gap: 7 (ref 5–15)
BILIRUBIN TOTAL: 0.3 mg/dL (ref 0.3–1.2)
BUN: 13 mg/dL (ref 6–20)
CALCIUM: 9.1 mg/dL (ref 8.9–10.3)
CO2: 28 mmol/L (ref 22–32)
CREATININE: 1.16 mg/dL — AB (ref 0.44–1.00)
Chloride: 102 mmol/L (ref 101–111)
GFR, EST AFRICAN AMERICAN: 53 mL/min — AB (ref >60)
GFR, EST NON AFRICAN AMERICAN: 46 mL/min — AB (ref >60)
Glucose, Bld: 244 mg/dL — ABNORMAL HIGH (ref 65–99)
Potassium: 4 mmol/L (ref 3.5–5.1)
Sodium: 137 mmol/L (ref 135–145)
TOTAL PROTEIN: 6.9 g/dL (ref 6.5–8.1)

## 2015-02-08 LAB — CBC WITH DIFFERENTIAL/PLATELET
BASOS ABS: 0 10*3/uL (ref 0.0–0.1)
Basophils Relative: 0 %
EOS PCT: 2 %
Eosinophils Absolute: 0.1 10*3/uL (ref 0.0–0.7)
HEMATOCRIT: 33.9 % — AB (ref 36.0–46.0)
Hemoglobin: 10.6 g/dL — ABNORMAL LOW (ref 12.0–15.0)
LYMPHS ABS: 1.2 10*3/uL (ref 0.7–4.0)
LYMPHS PCT: 17 %
MCH: 26 pg (ref 26.0–34.0)
MCHC: 31.3 g/dL (ref 30.0–36.0)
MCV: 83.1 fL (ref 78.0–100.0)
Monocytes Absolute: 0.3 10*3/uL (ref 0.1–1.0)
Monocytes Relative: 4 %
NEUTROS ABS: 5.2 10*3/uL (ref 1.7–7.7)
Neutrophils Relative %: 77 %
PLATELETS: 202 10*3/uL (ref 150–400)
RBC: 4.08 MIL/uL (ref 3.87–5.11)
RDW: 16 % — ABNORMAL HIGH (ref 11.5–15.5)
WBC: 6.8 10*3/uL (ref 4.0–10.5)

## 2015-02-08 LAB — I-STAT TROPONIN, ED: TROPONIN I, POC: 0.01 ng/mL (ref 0.00–0.08)

## 2015-02-08 LAB — D-DIMER, QUANTITATIVE: D-Dimer, Quant: 1.12 ug/mL-FEU — ABNORMAL HIGH (ref 0.00–0.50)

## 2015-02-08 MED ORDER — IOHEXOL 350 MG/ML SOLN
80.0000 mL | Freq: Once | INTRAVENOUS | Status: AC | PRN
  Administered 2015-02-08: 80 mL via INTRAVENOUS

## 2015-02-08 MED ORDER — ASPIRIN 81 MG PO CHEW
81.0000 mg | CHEWABLE_TABLET | Freq: Once | ORAL | Status: AC
  Administered 2015-02-08: 81 mg via ORAL
  Filled 2015-02-08: qty 1

## 2015-02-08 NOTE — ED Notes (Signed)
Pt c/o feeling her heart racing onset last night. Pt reports that it is slowing down. Pt was seen at her PMD for pain in left hand and was given medication. Pt read the side effects of the medication and heart racing was one of the symptoms.

## 2015-02-08 NOTE — Discharge Instructions (Signed)
I'm unsure what caused her palpitations this morning. We are glad that you feel much better. We want you  to follow-up with your regular physician. Please return if this happens again. Please return with any chest pain. \ Please take your baby asprin as your perscribed by youre regular physician.

## 2015-02-08 NOTE — ED Notes (Signed)
Patient transported to CT 

## 2015-02-08 NOTE — ED Provider Notes (Signed)
CSN: [id number]     Arrival date & time 02/08/15  [id number] History   First MD Initiated Contact with Patient 02/08/15 [phone]     Chief Complaint  Patient presents with  . Tachycardia     (Consider location/radiation/quality/duration/timing/severity/associated sxs/prior Treatment) HPI   Patient is a very pleasant [age] year old female with history of hypertension diabetes hyperlipidemia and gout presenting today with feeling of her heart racing. Patient was seen recently by her physician a week and half ago and diagnosed with gout in her left wrist. She started on prednisone as well as Percocet for pain. Patient reports stopping taking her prednisone a couple days ago since it was resolving. She reports minimal tenderness in her left wrist. This morning patient woke up and felt like her heart was beating very quickly. She had no chest pain. No shortness of breath no diaphoresis no pain in either arm or back.  Patient does feel like she's been less mobile than usual given her left wrist pain. However no long car trips or plane trips. Not on any exogenous estrogens.   Past Medical History  Diagnosis Date  . Hypertension   . Diabetes mellitus   . TIA (transient ischemic attack)   . Hyperlipidemia   . Arthritis   . Kidney stone on right side 5/15  . Ureteral stricture, right 5/15   Past Surgical History  Procedure Laterality Date  . Cholecystectomy    . Abdominal hysterectomy     No family history on file. Social History  Substance Use Topics  . Smoking status: Never Smoker   . Smokeless tobacco: None  . Alcohol Use: No   OB History    No data available     Review of Systems  Constitutional: Negative for fever, activity change and fatigue.  HENT: Negative for congestion.   Eyes: Negative for discharge.  Respiratory: Negative for cough and chest tightness.   Cardiovascular: Positive for palpitations. Negative for chest pain.  Gastrointestinal: Negative for abdominal pain and  abdominal distention.  Genitourinary: Negative for dysuria.  Musculoskeletal: Positive for joint swelling.  Skin: Negative for rash.  Allergic/Immunologic: Negative for immunocompromised state.  Neurological: Negative for seizures.  Psychiatric/Behavioral: Negative for agitation.      Allergies  Metformin and related  Home Medications   Prior to Admission medications   Medication Sig Start Date End Date Taking? Authorizing Provider  allopurinol (ZYLOPRIM) 100 MG tablet Take 100 mg by mouth daily. 06/18/14  Yes Historical Provider, MD  amLODipine (NORVASC) 5 MG tablet Take 5 mg by mouth daily. 10/21/14  Yes Historical Provider, MD  aspirin 81 MG tablet Take 81 mg by mouth daily.   Yes Historical Provider, MD  colchicine 0.6 MG tablet Take 0.6 mg by mouth daily.    Yes Historical Provider, MD  HYDROcodone-acetaminophen (NORCO) 5-325 MG per tablet Take 1 tablet by mouth every 6 (six) hours as needed. 08/08/14  Yes Toy CookeyMegan Docherty, MD  JANUVIA 100 MG tablet Take 100 mg by mouth daily. 09/02/14  Yes Historical Provider, MD  lisinopril (PRINIVIL,ZESTRIL) 40 MG tablet Take 40 mg by mouth daily.   Yes Historical Provider, MD  naproxen (NAPROSYN) 500 MG tablet Take 1 tablet (500 mg total) by mouth 2 (two) times daily as needed for mild pain, moderate pain or headache (TAKE WITH MEALS.). 07/03/14  Yes Mercedes Camprubi-Soms, PA-C  naproxen sodium (ANAPROX) 220 MG tablet Take 440 mg by mouth 2 (two) times daily as needed (FOR PAIN).   Yes Historical Provider, MD  predniSONE (DELTASONE) 10 MG tablet Take 2 tablets (20 mg total) by mouth daily. 07/07/14  Yes Arby Barrette, MD  simvastatin (ZOCOR) 10 MG tablet Take 10 mg by mouth daily at 6 PM.   Yes Historical Provider, MD  Vitamin D, Ergocalciferol, (DRISDOL) 50000 UNITS CAPS capsule Take 50,000 Units by mouth every Friday.   Yes Historical Provider, MD  cyclobenzaprine (FLEXERIL) 10 MG tablet Take 1 tablet (10 mg total) by mouth 3 (three) times daily as  needed for muscle spasms. Patient not taking: Reported on 02/08/2015 07/03/14   Mercedes Camprubi-Soms, PA-C   BP 153/70 mmHg  Pulse 92  Temp(Src) 98 F (36.7 C) (Oral)  Resp 14  Ht  (1.499 m)  Wt 210 lb (95.255 kg)  BMI 42.39 kg/m2  SpO2 100% Physical Exam  Constitutional: She is oriented to person, place, and time. She appears well-developed and well-nourished.  Obese AA female well appearing NAD  HENT:  Head: Normocephalic and atraumatic.  Eyes: Conjunctivae are normal. Right eye exhibits no discharge.  Neck: Neck supple.  Cardiovascular: Normal rate, regular rhythm and normal heart sounds.   No murmur heard. Pulmonary/Chest: Effort normal and breath sounds normal. She has no wheezes. She has no rales.  Abdominal: Soft. She exhibits no distension. There is no tenderness.  Musculoskeletal: Normal range of motion. She exhibits no edema.  Swelling to L wrist, no ertyehma, mild tenderness.  Neurological: She is oriented to person, place, and time. No cranial nerve deficit.  Skin: Skin is warm and dry. No rash noted. She is not diaphoretic.  Psychiatric: She has a normal mood and affect. Her behavior is normal.  Nursing note and vitals reviewed.   ED Course  Procedures (including critical care time) Labs Review Labs Reviewed  CBC WITH DIFFERENTIAL/PLATELET - Abnormal; Notable for the following:    Hemoglobin 10.6 (*)    HCT 33.9 (*)    RDW 16.0 (*)    All other components within normal limits  COMPREHENSIVE METABOLIC PANEL - Abnormal; Notable for the following:    Glucose, Bld 244 (*)    Creatinine, Ser 1.16 (*)    Albumin 3.0 (*)    GFR calc non Af Amer 46 (*)    GFR calc Af Amer 53 (*)    All other components within normal limits  D-DIMER, QUANTITATIVE (NOT AT [facility]) - Abnormal; Notable for the following:    D-Dimer, Quant 1.12 (*)    All other components within normal limits  I-STAT TROPOININ, ED    Imaging Review Ct Angio Chest Aorta W/cm &/or  Wo/cm  02/08/2015  CLINICAL DATA:  Tachycardia EXAM: CT ANGIOGRAPHY CHEST WITH CONTRAST TECHNIQUE: Multidetector CT imaging of the chest was performed using the standard protocol during bolus administration of intravenous contrast. Multiplanar CT image reconstructions and MIPs were obtained to evaluate the vascular anatomy. CONTRAST:  80mL OMNIPAQUE IOHEXOL 350 MG/ML SOLN COMPARISON:  06/25/2010 FINDINGS: Lungs are well aerated bilaterally. No focal infiltrate or sizable effusion is seen. Stable enlargement of the left lobe of the thyroid is noted with heterogeneity in calcifications within. The thoracic aorta shows calcifications without aneurysmal dilatation or dissection. Pulmonary artery demonstrates a normal branching pattern. No evidence of pulmonary embolus is identified. The hilar and mediastinal structures show no significant lymphadenopathy. Mild moderate coronary calcifications are seen. No right heart strain is noted. The visualized upper abdomen is within normal limits. Degenerative changes of thoracic spine are noted. Review of the MIP images confirms the above findings. IMPRESSION: Stable enlargement  of the left lobe of the thyroid. No evidence of pulmonary emboli. Electronically Signed   By: Alcide Clever M.D.   On: 02/08/2015 11:55   I have personally reviewed and evaluated these images and lab results as part of my medical decision-making.   EKG Interpretation   Date/Time:  Sunday February 08 2015 08:54:47 EST Ventricular Rate:  97 PR Interval:  150 QRS Duration: 56 QT Interval:  342 QTC Calculation: 434 R Axis:   57 Text Interpretation:  Sinus rhythm with Premature atrial complexes  Anterior infarct , age undetermined Abnormal ECG no acute ischemia No  significant change since last tracing Confirmed by Kandis Mannan  [id number]) on 02/08/2015 9:27:10 AM      MDM   Final diagnoses:  Tachycardia    Patient is a pleasant 74 year old female presenting today with  palpitations. Patient has history of hypertension hyperlipidemia and diabetes. Recently treated for acute gout flare. The gout flares is now  resolving. Patient concerned because she woke up this morning and had palpitations. No chest pain, pressure, or chest pain equivalent. This has since resolved since arrival to the hospital. Initial heart rate was 105. This makes her ineligible for PERC criteria.  WIll send d dimer.   EKG non ischemic. She appears well, NAD.   Not sure that etiology of her palpitations are. However given that they've resolved she has normal heart rate and nonischemic EKG this is reassuring. We will get single d-dimer and troponin. However without chest pain do not think is necessary to do delta trops.   10:49 AM Elevated D dimer.  Will get CT PE.   12:04 PM Filed Vitals:   02/08/15 1100 02/08/15 1153  BP: 169/76 153/70  Pulse:  92  Temp:    Resp: 21 14   PE negative.  Patient ambulatory and NAD with normal vital signs and PE on discharge.    Brooklin Rieger Randall An, MD 02/08/15 [phone]

## 2015-02-08 NOTE — ED Notes (Signed)
PT back from CT

## 2015-07-02 ENCOUNTER — Other Ambulatory Visit: Payer: Self-pay

## 2015-07-02 DIAGNOSIS — Z1231 Encounter for screening mammogram for malignant neoplasm of breast: Secondary | ICD-10-CM

## 2015-07-22 ENCOUNTER — Ambulatory Visit
Admission: RE | Admit: 2015-07-22 | Discharge: 2015-07-22 | Disposition: A | Discharge status: Home / Self Care | Payer: Medicare Other | Source: Ambulatory Visit

## 2015-07-22 DIAGNOSIS — Z1231 Encounter for screening mammogram for malignant neoplasm of breast: Secondary | ICD-10-CM

## 2015-07-25 ENCOUNTER — Emergency Department (HOSPITAL_COMMUNITY): Payer: Medicare Other

## 2015-07-25 ENCOUNTER — Emergency Department (HOSPITAL_COMMUNITY)
Admission: EM | Admit: 2015-07-25 | Discharge: 2015-07-25 | Disposition: A | Discharge status: Home / Self Care | Payer: Medicare Other | Attending: Emergency Medicine | Admitting: Emergency Medicine

## 2015-07-25 ENCOUNTER — Encounter (HOSPITAL_COMMUNITY): Payer: Self-pay

## 2015-07-25 DIAGNOSIS — Z79899 Other long term (current) drug therapy: Secondary | ICD-10-CM | POA: Diagnosis not present

## 2015-07-25 DIAGNOSIS — M1990 Unspecified osteoarthritis, unspecified site: Secondary | ICD-10-CM | POA: Diagnosis not present

## 2015-07-25 DIAGNOSIS — I10 Essential (primary) hypertension: Secondary | ICD-10-CM | POA: Diagnosis not present

## 2015-07-25 DIAGNOSIS — Z9049 Acquired absence of other specified parts of digestive tract: Secondary | ICD-10-CM | POA: Diagnosis not present

## 2015-07-25 DIAGNOSIS — Z7984 Long term (current) use of oral hypoglycemic drugs: Secondary | ICD-10-CM | POA: Insufficient documentation

## 2015-07-25 DIAGNOSIS — R1010 Upper abdominal pain, unspecified: Secondary | ICD-10-CM | POA: Insufficient documentation

## 2015-07-25 DIAGNOSIS — R110 Nausea: Secondary | ICD-10-CM | POA: Diagnosis not present

## 2015-07-25 DIAGNOSIS — Z87442 Personal history of urinary calculi: Secondary | ICD-10-CM | POA: Diagnosis not present

## 2015-07-25 DIAGNOSIS — E119 Type 2 diabetes mellitus without complications: Secondary | ICD-10-CM | POA: Diagnosis not present

## 2015-07-25 DIAGNOSIS — E785 Hyperlipidemia, unspecified: Secondary | ICD-10-CM | POA: Insufficient documentation

## 2015-07-25 DIAGNOSIS — Z7982 Long term (current) use of aspirin: Secondary | ICD-10-CM | POA: Diagnosis not present

## 2015-07-25 DIAGNOSIS — Z87448 Personal history of other diseases of urinary system: Secondary | ICD-10-CM | POA: Diagnosis not present

## 2015-07-25 DIAGNOSIS — R202 Paresthesia of skin: Secondary | ICD-10-CM

## 2015-07-25 DIAGNOSIS — Z90710 Acquired absence of both cervix and uterus: Secondary | ICD-10-CM | POA: Insufficient documentation

## 2015-07-25 LAB — CBC
HEMATOCRIT: 39 % (ref 36.0–46.0)
HEMOGLOBIN: 12.5 g/dL (ref 12.0–15.0)
MCH: 26.3 pg (ref 26.0–34.0)
MCHC: 32.1 g/dL (ref 30.0–36.0)
MCV: 82.1 fL (ref 78.0–100.0)
Platelets: 246 10*3/uL (ref 150–400)
RBC: 4.75 MIL/uL (ref 3.87–5.11)
RDW: 14.7 % (ref 11.5–15.5)
WBC: 6.2 10*3/uL (ref 4.0–10.5)

## 2015-07-25 LAB — COMPREHENSIVE METABOLIC PANEL
ALBUMIN: 3.7 g/dL (ref 3.5–5.0)
ALK PHOS: 71 U/L (ref 38–126)
ALT: 25 U/L (ref 14–54)
ANION GAP: 10 (ref 5–15)
AST: 19 U/L (ref 15–41)
BILIRUBIN TOTAL: 0.7 mg/dL (ref 0.3–1.2)
BUN: 20 mg/dL (ref 6–20)
CALCIUM: 9.9 mg/dL (ref 8.9–10.3)
CO2: 26 mmol/L (ref 22–32)
Chloride: 101 mmol/L (ref 101–111)
Creatinine, Ser: 1.45 mg/dL — ABNORMAL HIGH (ref 0.44–1.00)
GFR calc non Af Amer: 35 mL/min — ABNORMAL LOW (ref >60)
GFR, EST AFRICAN AMERICAN: 40 mL/min — AB (ref >60)
GLUCOSE: 165 mg/dL — AB (ref 65–99)
POTASSIUM: 3.8 mmol/L (ref 3.5–5.1)
Sodium: 137 mmol/L (ref 135–145)
TOTAL PROTEIN: 7.5 g/dL (ref 6.5–8.1)

## 2015-07-25 LAB — URINALYSIS, ROUTINE W REFLEX MICROSCOPIC
BILIRUBIN URINE: NEGATIVE
Glucose, UA: NEGATIVE mg/dL
Ketones, ur: NEGATIVE mg/dL
LEUKOCYTES UA: NEGATIVE
NITRITE: NEGATIVE
PH: 5 (ref 5.0–8.0)
Protein, ur: >300 mg/dL — AB
SPECIFIC GRAVITY, URINE: 1.022 (ref 1.005–1.030)

## 2015-07-25 LAB — URINE MICROSCOPIC-ADD ON

## 2015-07-25 LAB — TROPONIN I

## 2015-07-25 LAB — LIPASE, BLOOD: Lipase: 51 U/L (ref 11–51)

## 2015-07-25 MED ORDER — FAMOTIDINE 20 MG PO TABS
20.0000 mg | ORAL_TABLET | Freq: Once | ORAL | Status: AC
  Administered 2015-07-25: 20 mg via ORAL
  Filled 2015-07-25: qty 1

## 2015-07-25 MED ORDER — ALUM & MAG HYDROXIDE-SIMETH 200-200-20 MG/5ML PO SUSP
30.0000 mL | Freq: Once | ORAL | Status: AC
  Administered 2015-07-25: 30 mL via ORAL
  Filled 2015-07-25: qty 30

## 2015-07-25 NOTE — ED Notes (Signed)
Patient here with generalized abdominal pain x 2 days, nausea with same. Also concerned because she has some numbness in her finger tips to bilateral hands for several days. Also reports during the night she felt shortness of breath

## 2015-07-25 NOTE — ED Provider Notes (Signed)
CSN: [id number]     Arrival date & time 07/25/15  4098 History   First MD Initiated Contact with Patient 07/25/15 [phone]     Chief Complaint  Patient presents with  . Abdominal Pain     (Consider location/radiation/quality/duration/timing/severity/associated sxs/prior Treatment) Patient is a 75 y.o. female presenting with abdominal pain. The history is provided by the patient.  Abdominal Pain Associated symptoms: nausea   Associated symptoms: no chest pain, no chills, no cough, no dysuria, no fever, no sore throat and no vomiting   Patient c/o upper abdominal pain in the past 1-2 days. Nausea. No vomiting. States stomach feels unseettled/churned up.  Having normal bms. No diarrhea or constipation. No abd distension. Denies chest pain or discomfort. No sob. Denies same pain in past. Denies fever or chills. Prior abd surgery includes remote hx cholecystectomy and hysterectomy.  Denies dysuria or gu c/o. No back or flank pain. States last night felt mild sob. No sob currently. States also bil finger tips felt numb/tingly. No unilateral loss of sensation/numbness. No weakness or loss of normal functional ability. Patient also requests her blood sugar be checked.      Past Medical History  Diagnosis Date  . Hypertension   . Diabetes mellitus   . TIA (transient ischemic attack)   . Hyperlipidemia   . Arthritis   . Kidney stone on right side 5/15  . Ureteral stricture, right 5/15   Past Surgical History  Procedure Laterality Date  . Cholecystectomy    . Abdominal hysterectomy     No family history on file. Social History  Substance Use Topics  . Smoking status: Never Smoker   . Smokeless tobacco: None  . Alcohol Use: No   OB History    No data available     Review of Systems  Constitutional: Negative for fever and chills.  HENT: Negative for sore throat.   Eyes: Negative for visual disturbance.  Respiratory: Negative for cough.   Cardiovascular: Negative for chest pain.   Gastrointestinal: Positive for nausea and abdominal pain. Negative for vomiting.  Genitourinary: Negative for dysuria and flank pain.  Musculoskeletal: Negative for back pain and neck pain.  Skin: Negative for rash.  Neurological: Negative for speech difficulty, weakness and headaches.  Hematological: Does not bruise/bleed easily.  Psychiatric/Behavioral: Negative for confusion.      Allergies  Metformin and related  Home Medications   Prior to Admission medications   Medication Sig Start Date End Date Taking? Authorizing Provider  allopurinol (ZYLOPRIM) 100 MG tablet Take 100 mg by mouth daily. 06/18/14  Yes Historical Provider, MD  amLODipine (NORVASC) 5 MG tablet Take 5 mg by mouth daily. 10/21/14  Yes Historical Provider, MD  aspirin 81 MG tablet Take 81 mg by mouth daily.   Yes Historical Provider, MD  colchicine 0.6 MG tablet Take 0.6 mg by mouth daily.    Yes Historical Provider, MD  glimepiride (AMARYL) 4 MG tablet Take 4 mg by mouth daily. 05/15/15  Yes Historical Provider, MD  JANUVIA 100 MG tablet Take 100 mg by mouth daily. 09/02/14  Yes Historical Provider, MD  latanoprost (XALATAN) 0.005 % ophthalmic solution Place 1 drop into both eyes every evening. 07/17/15  Yes Historical Provider, MD  lisinopril (PRINIVIL,ZESTRIL) 40 MG tablet Take 40 mg by mouth daily.   Yes Historical Provider, MD  omeprazole (PRILOSEC) 20 MG capsule Take 20 mg by mouth daily as needed. heartburn 05/21/15  Yes Historical Provider, MD  simvastatin (ZOCOR) 10 MG tablet Take 10  mg by mouth daily at 6 PM.   Yes Historical Provider, MD  Vitamin D, Ergocalciferol, (DRISDOL) 50000 UNITS CAPS capsule Take 50,000 Units by mouth every Friday.   Yes Historical Provider, MD  cyclobenzaprine (FLEXERIL) 10 MG tablet Take 1 tablet (10 mg total) by mouth 3 (three) times daily as needed for muscle spasms. Patient not taking: Reported on 02/08/2015 07/03/14   Mercedes Camprubi-Soms, PA-C  HYDROcodone-acetaminophen (NORCO)  5-325 MG per tablet Take 1 tablet by mouth every 6 (six) hours as needed. Patient not taking: Reported on 07/25/2015 08/08/14   Toy CookeyMegan Docherty, MD  naproxen (NAPROSYN) 500 MG tablet Take 1 tablet (500 mg total) by mouth 2 (two) times daily as needed for mild pain, moderate pain or headache (TAKE WITH MEALS.). Patient not taking: Reported on 07/25/2015 07/03/14   Mercedes Camprubi-Soms, PA-C  predniSONE (DELTASONE) 10 MG tablet Take 2 tablets (20 mg total) by mouth daily. Patient not taking: Reported on 07/25/2015 07/07/14   Arby BarretteMarcy Pfeiffer, MD   BP 153/74 mmHg  Pulse 97  Temp(Src) 98.4 F (36.9 C) (Oral)  Resp 16  Ht 5\' 2"  (1.575 m)  Wt 95.255 kg  BMI 38.40 kg/m2  SpO2 100% Physical Exam  Constitutional: She is oriented to person, place, and time. She appears well-developed and well-nourished. No distress.  HENT:  Mouth/Throat: Oropharynx is clear and moist.  Eyes: Conjunctivae are normal. No scleral icterus.  Neck: Neck supple. No tracheal deviation present. No thyromegaly present.  Cardiovascular: Normal rate, regular rhythm, normal heart sounds and intact distal pulses.  Exam reveals no gallop and no friction rub.   No murmur heard. Pulmonary/Chest: Effort normal and breath sounds normal. No respiratory distress.  Abdominal: Soft. Normal appearance and bowel sounds are normal. She exhibits no distension and no mass. There is no tenderness. There is no guarding.  Obese. Well healed surgical scars. No incarc hernia felt.   Genitourinary:  No cva tenderness  Musculoskeletal: She exhibits no edema.  Neurological: She is alert and oriented to person, place, and time.  Speech clear/fluent. Motor intact bil. stre 5/5. sens grossly intact. Steady gait.   Skin: Skin is warm and dry. No rash noted. She is not diaphoretic.  Psychiatric: She has a normal mood and affect.  Nursing note and vitals reviewed.   ED Course  Procedures (including critical care time) Labs Review   Results for orders  placed or performed during the hospital encounter of 07/25/15  Lipase, blood  Result Value Ref Range   Lipase 51 11 - 51 U/L  Comprehensive metabolic panel  Result Value Ref Range   Sodium 137 135 - 145 mmol/L   Potassium 3.8 3.5 - 5.1 mmol/L   Chloride 101 101 - 111 mmol/L   CO2 26 22 - 32 mmol/L   Glucose, Bld 165 (H) 65 - 99 mg/dL   BUN 20 6 - 20 mg/dL   Creatinine, Ser 1.611.45 (H) 0.44 - 1.00 mg/dL   Calcium 9.9 8.9 - 09.610.3 mg/dL   Total Protein 7.5 6.5 - 8.1 g/dL   Albumin 3.7 3.5 - 5.0 g/dL   AST 19 15 - 41 U/L   ALT 25 14 - 54 U/L   Alkaline Phosphatase 71 38 - 126 U/L   Total Bilirubin 0.7 0.3 - 1.2 mg/dL   GFR calc non Af Amer 35 (L) >60 mL/min   GFR calc Af Amer 40 (L) >60 mL/min   Anion gap 10 5 - 15  CBC  Result Value Ref Range  WBC 6.2 4.0 - 10.5 K/uL   RBC 4.75 3.87 - 5.11 MIL/uL   Hemoglobin 12.5 12.0 - 15.0 g/dL   HCT 82.9 56.2 - 13.0 %   MCV 82.1 78.0 - 100.0 fL   MCH 26.3 26.0 - 34.0 pg   MCHC 32.1 30.0 - 36.0 g/dL   RDW 86.5 78.4 - 69.6 %   Platelets 246 150 - 400 K/uL  Urinalysis, Routine w reflex microscopic  Result Value Ref Range   Color, Urine YELLOW YELLOW   APPearance CLOUDY (A) CLEAR   Specific Gravity, Urine 1.022 1.005 - 1.030   pH 5.0 5.0 - 8.0   Glucose, UA NEGATIVE NEGATIVE mg/dL   Hgb urine dipstick TRACE (A) NEGATIVE   Bilirubin Urine NEGATIVE NEGATIVE   Ketones, ur NEGATIVE NEGATIVE mg/dL   Protein, ur >295 (A) NEGATIVE mg/dL   Nitrite NEGATIVE NEGATIVE   Leukocytes, UA NEGATIVE NEGATIVE  Troponin I  Result Value Ref Range   Troponin I <0.03 <0.031 ng/mL  Urine microscopic-add on  Result Value Ref Range   Squamous Epithelial / LPF 6-30 (A) NONE SEEN   WBC, UA 0-5 0 - 5 WBC/hpf   RBC / HPF 0-5 0 - 5 RBC/hpf   Bacteria, UA MANY (A) NONE SEEN   Dg Chest 2 View  07/25/2015  CLINICAL DATA:  Center CP since last night. SOB last night but not today. Hx of HTN, DM, TIA. Nonsmoker. EXAM: CHEST  2 VIEW COMPARISON:  CT 02/08/2015.   Radiograph 01/23/2015. FINDINGS: Lateral view degraded by patient arm position. Midline trachea. Normal heart size. Atherosclerosis in the transverse aorta. lower chest is excluded from the frontal radiograph. Given this factor, no pleural fluid or pneumothorax identified. Low lung volumes. No lobar consolidation. IMPRESSION: No acute cardiopulmonary disease. Aortic atherosclerosis. Exclusion of the lung bases from the frontal radiograph. Electronically Signed   By: Jeronimo Greaves M.D.   On: 07/25/2015 11:53     I have personally reviewed and evaluated these images and lab results as part of my medical decision-making.   EKG Interpretation   Date/Time:  Saturday Jul 25 2015 09:51:00 EDT Ventricular Rate:  108 PR Interval:  132 QRS Duration: 76 QT Interval:  334 QTC Calculation: 447 R Axis:   48 Text Interpretation:  Sinus tachycardia Premature atrial complexes  Confirmed by Denton Lank  MD, Caryn Bee ([id number]) on 07/25/2015 10:30:38 AM      MDM   Iv ns. Labs.  Reviewed nursing notes and prior charts for additional history.   pepcid and gi cocktail for symptom relief.  Recheck patient, no numbness/tingling of finger tips. No cp or sob.   No nv. Tolerating po. Abd soft nt.  Pt currently appears stable for d/c.     Cathren Laine, MD 07/25/15 1221

## 2015-07-25 NOTE — Discharge Instructions (Signed)
It was our pleasure to provide your ER care today - we hope that you feel better.  Rest. Drink plenty of fluids.  Follow up with primary care doctor in the next few days if symptoms fail to improve/resolve.  Return to ER if worse, new symptoms, fevers, worsening or severe abdominal pain, persistent vomiting, chest pain, one-side of body numbness or weakness, other concern.     Abdominal Pain, Adult Many things can cause abdominal pain. Usually, abdominal pain is not caused by a disease and will improve without treatment. It can often be observed and treated at home. Your health care provider will do a physical exam and possibly order blood tests and X-rays to help determine the seriousness of your pain. However, in many cases, more time must pass before a clear cause of the pain can be found. Before that point, your health care provider may not know if you need more testing or further treatment. HOME CARE INSTRUCTIONS Monitor your abdominal pain for any changes. The following actions may help to alleviate any discomfort you are experiencing:  Only take over-the-counter or prescription medicines as directed by your health care provider.  Do not take laxatives unless directed to do so by your health care provider.  Try a clear liquid diet (broth, tea, or water) as directed by your health care provider. Slowly move to a bland diet as tolerated. SEEK MEDICAL CARE IF:  You have unexplained abdominal pain.  You have abdominal pain associated with nausea or diarrhea.  You have pain when you urinate or have a bowel movement.  You experience abdominal pain that wakes you in the night.  You have abdominal pain that is worsened or improved by eating food.  You have abdominal pain that is worsened with eating fatty foods.  You have a fever. SEEK IMMEDIATE MEDICAL CARE IF:  Your pain does not go away within 2 hours.  You keep throwing up (vomiting).  Your pain is felt only in portions of  the abdomen, such as the right side or the left lower portion of the abdomen.  You pass bloody or black tarry stools. MAKE SURE YOU:  Understand these instructions.  Will watch your condition.  Will get help right away if you are not doing well or get worse.   This information is not intended to replace advice given to you by your health care provider. Make sure you discuss any questions you have with your health care provider.   Document Released: 11/24/2004 Document Revised: 11/05/2014 Document Reviewed: 10/24/2012 Elsevier Interactive Patient Education 2016 [employer]    Paresthesia Paresthesia is an abnormal burning or prickling sensation. This sensation is generally felt in the hands, arms, legs, or feet. However, it may occur in any part of the body. Usually, it is not painful. The feeling may be described as:  Tingling or numbness.  Pins and needles.  Skin crawling.  Buzzing.  Limbs falling asleep.  Itching. Most people experience temporary (transient) paresthesia at some time in their lives. Paresthesia may occur when you breathe too quickly (hyperventilation). It can also occur without any apparent cause. Commonly, paresthesia occurs when pressure is placed on a nerve. The sensation quickly goes away after the pressure is removed. For some people, however, paresthesia is a long-lasting (chronic) condition that is caused by an underlying disorder. If you continue to have paresthesia, you may need further medical evaluation. HOME CARE INSTRUCTIONS Watch your condition for any changes. Taking the following actions may help to lessen  any discomfort that you are feeling:  Avoid drinking alcohol.  Try acupuncture or massage to help relieve your symptoms.  Keep all follow-up visits as directed by your health care provider. This is important. SEEK MEDICAL CARE IF:  You continue to have episodes of paresthesia.  Your burning or prickling feeling gets worse when you  walk.  You have pain, cramps, or dizziness.  You develop a rash. SEEK IMMEDIATE MEDICAL CARE IF:  You feel weak.  You have trouble walking or moving.  You have problems with speech, understanding, or vision.  You feel confused.  You cannot control your bladder or bowel movements.  You have numbness after an injury.  You faint.   This information is not intended to replace advice given to you by your health care provider. Make sure you discuss any questions you have with your health care provider.   Document Released: 02/04/2002 Document Revised: 07/01/2014 Document Reviewed: 02/10/2014 Elsevier Interactive Patient Education [employer]

## 2015-08-05 ENCOUNTER — Ambulatory Visit (INDEPENDENT_AMBULATORY_CARE_PROVIDER_SITE_OTHER): Payer: Medicare Other

## 2015-08-05 ENCOUNTER — Encounter: Payer: Self-pay | Admitting: Podiatry

## 2015-08-05 ENCOUNTER — Ambulatory Visit (INDEPENDENT_AMBULATORY_CARE_PROVIDER_SITE_OTHER): Payer: Medicare Other | Admitting: Podiatry

## 2015-08-05 ENCOUNTER — Telehealth: Payer: Self-pay | Admitting: *Deleted

## 2015-08-05 VITALS — BP 142/88 | HR 69 | Resp 12

## 2015-08-05 DIAGNOSIS — M79671 Pain in right foot: Secondary | ICD-10-CM

## 2015-08-05 DIAGNOSIS — M25474 Effusion, right foot: Secondary | ICD-10-CM | POA: Diagnosis not present

## 2015-08-05 DIAGNOSIS — R0989 Other specified symptoms and signs involving the circulatory and respiratory systems: Secondary | ICD-10-CM | POA: Diagnosis not present

## 2015-08-05 NOTE — Progress Notes (Signed)
   Subjective:    Patient ID: [name], female    DOB: May 10, 1940, 75 y.o.   MRN: [id number]  HPI   This patient presents today complaining of swelling, burning localized to the dorsal aspect of right foot for approximately 2 weeks. The symptoms have gradually worsened over this time. She says the swelling tends to decrease with rest and elevation of the foot and increased with standing, walking or when the foot is independency. Patient has a history of gout inflammation and initially she thought the swelling would be associated with gout. Patient is a type II diabetic  Review of Systems  Cardiovascular: Positive for leg swelling.  Musculoskeletal: Positive for gait problem.       Objective:   Physical Exam  Orientated 3  Vascular: No calf pain or calf edema bilaterally DP right 2/4 and 0/4 left PT right 0/4 and PT left 2/4 Capillary reflex delayed bilaterally  Neurological: Sensation to 10 g monofilament wire intact 5/5 bilaterally Vibratory sensation reactive bilaterally Ankle reflex equal reactive bilaterally  Dermatological: No open skin lesions bilaterally Localized edema dorsal lateral aspect of the right foot No erythema, warmth noted in the localized area of edema on the dorsal aspect of the right foot  Musculoskeletal: HAV left No restriction ankle, subtalar, midtarsal joints bilaterally   X-ray examination weightbearing right foot dated 08/05/2015  Intact bony structures without fracture and/or dislocation Decreased bone density in all views Cystic changes metatarsals and midfoot Significant decrease of joint space at Lisfranc's articulation  Radiographic impression: No acute bony abnormality noted left foot Degenerative diffuse changes in metatarsals and Lisfranc's articulation    Assessment & Plan:   Assessment: Absent pedal pulses bilaterally Edema right foot with definitive diagnosis pending As edema seems very localized and there is no  warmth gout is not an obvious diagnosis at this time, however cannot rule out a resolving gout flare Cannot rule out possible Charcot's Significant degenerative changes right foot  Plan: Today I reviewed the results of examination and x-ray with the patient. I recommended protective weightbearing and limit standing walking and dispensed a surgical shoe to wear the right foot Patient referred to vascular lab for lower extremity arterial Doppler for the indication is absent pedal pulses and diabetic with a history of peripheral neuropathy  Patient advised that she develops any sudden pain, swelling, warmth present to the emergency department  Reappoint 7 days

## 2015-08-05 NOTE — Telephone Encounter (Signed)
[facility]

## 2015-08-05 NOTE — Patient Instructions (Signed)
The specific cause of your foot pain is under evaluation X-ray today demonstrated arthritic degenerative changes Referring you to the vascular lab for circulation check for arterial supply to your foot Dispensed surgical shoe to protect right foot and wear and right foot If you experience any significant increase Pain, swelling present to the emergency department  Diabetes and Foot Care Diabetes may cause you to have problems because of poor blood supply (circulation) to your feet and legs. This may cause the skin on your feet to become thinner, break easier, and heal more slowly. Your skin may become dry, and the skin may peel and crack. You may also have nerve damage in your legs and feet causing decreased feeling in them. You may not notice minor injuries to your feet that could lead to infections or more serious problems. Taking care of your feet is one of the most important things you can do for yourself.  HOME CARE INSTRUCTIONS  Wear shoes at all times, even in the house. Do not go barefoot. Bare feet are easily injured.  Check your feet daily for blisters, cuts, and redness. If you cannot see the bottom of your feet, use a mirror or ask someone for help.  Wash your feet with warm water (do not use hot water) and mild soap. Then pat your feet and the areas between your toes until they are completely dry. Do not soak your feet as this can dry your skin.  Apply a moisturizing lotion or petroleum jelly (that does not contain alcohol and is unscented) to the skin on your feet and to dry, brittle toenails. Do not apply lotion between your toes.  Trim your toenails straight across. Do not dig under them or around the cuticle. File the edges of your nails with an emery board or nail file.  Do not cut corns or calluses or try to remove them with medicine.  Wear clean socks or stockings every day. Make sure they are not too tight. Do not wear knee-high stockings since they may decrease blood flow  to your legs.  Wear shoes that fit properly and have enough cushioning. To break in new shoes, wear them for just a few hours a day. This prevents you from injuring your feet. Always look in your shoes before you put them on to be sure there are no objects inside.  Do not cross your legs. This may decrease the blood flow to your feet.  If you find a minor scrape, cut, or break in the skin on your feet, keep it and the skin around it clean and dry. These areas may be cleansed with mild soap and water. Do not cleanse the area with peroxide, alcohol, or iodine.  When you remove an adhesive bandage, be sure not to damage the skin around it.  If you have a wound, look at it several times a day to make sure it is healing.  Do not use heating pads or hot water bottles. They may burn your skin. If you have lost feeling in your feet or legs, you may not know it is happening until it is too late.  Make sure your health care provider performs a complete foot exam at least annually or more often if you have foot problems. Report any cuts, sores, or bruises to your health care provider immediately. SEEK MEDICAL CARE IF:   You have an injury that is not healing.  You have cuts or breaks in the skin.  You have an  ingrown nail.  You notice redness on your legs or feet.  You feel burning or tingling in your legs or feet.  You have pain or cramps in your legs and feet.  Your legs or feet are numb.  Your feet always feel cold. SEEK IMMEDIATE MEDICAL CARE IF:   There is increasing redness, swelling, or pain in or around a wound.  There is a red line that goes up your leg.  Pus is coming from a wound.  You develop a fever or as directed by your health care provider.  You notice a bad smell coming from an ulcer or wound.   This information is not intended to replace advice given to you by your health care provider. Make sure you discuss any questions you have with your health care provider.    Document Released: 02/12/2000 Document Revised: 10/17/2012 Document Reviewed: 07/24/2012 Elsevier Interactive Patient Education [employer]

## 2015-08-10 ENCOUNTER — Other Ambulatory Visit: Payer: Self-pay | Admitting: Podiatry

## 2015-08-10 DIAGNOSIS — E1351 Other specified diabetes mellitus with diabetic peripheral angiopathy without gangrene: Secondary | ICD-10-CM

## 2015-08-10 DIAGNOSIS — R0989 Other specified symptoms and signs involving the circulatory and respiratory systems: Secondary | ICD-10-CM

## 2015-08-11 ENCOUNTER — Encounter: Payer: Self-pay | Admitting: Podiatry

## 2015-08-11 ENCOUNTER — Ambulatory Visit (INDEPENDENT_AMBULATORY_CARE_PROVIDER_SITE_OTHER): Payer: Medicare Other | Admitting: Podiatry

## 2015-08-11 DIAGNOSIS — M79676 Pain in unspecified toe(s): Secondary | ICD-10-CM

## 2015-08-11 DIAGNOSIS — M79671 Pain in right foot: Secondary | ICD-10-CM

## 2015-08-11 DIAGNOSIS — B351 Tinea unguium: Secondary | ICD-10-CM | POA: Diagnosis not present

## 2015-08-11 DIAGNOSIS — M25474 Effusion, right foot: Secondary | ICD-10-CM

## 2015-08-11 LAB — CBC WITH DIFFERENTIAL/PLATELET
BASOS ABS: 0 {cells}/uL (ref 0–200)
Basophils Relative: 0 %
EOS PCT: 3 %
Eosinophils Absolute: 213 cells/uL (ref 15–500)
HCT: 37.9 % (ref 35.0–45.0)
HEMOGLOBIN: 12 g/dL (ref 11.7–15.5)
LYMPHS ABS: 2059 {cells}/uL (ref 850–3900)
Lymphocytes Relative: 29 %
MCH: 26.7 pg — AB (ref 27.0–33.0)
MCHC: 31.7 g/dL — AB (ref 32.0–36.0)
MCV: 84.4 fL (ref 80.0–100.0)
MONOS PCT: 6 %
MPV: 10.6 fL (ref 7.5–12.5)
Monocytes Absolute: 426 cells/uL (ref 200–950)
NEUTROS PCT: 62 %
Neutro Abs: 4402 cells/uL (ref 1500–7800)
Platelets: 256 10*3/uL (ref 140–400)
RBC: 4.49 MIL/uL (ref 3.80–5.10)
RDW: 14.5 % (ref 11.0–15.0)
WBC: 7.1 10*3/uL (ref 3.8–10.8)

## 2015-08-11 LAB — SEDIMENTATION RATE: SED RATE: 34 mm/h — AB (ref 0–30)

## 2015-08-11 LAB — URIC ACID: Uric Acid, Serum: 8.6 mg/dL — ABNORMAL HIGH (ref 2.5–7.0)

## 2015-08-11 NOTE — Progress Notes (Signed)
Patient ID: [name], female   DOB: 01-06-1941, 75 y.o.   MRN: [id number]    Subjective: Patient presents today for follow-up visit of 08/05/2015. Patient has pending lower extremity arterial Doppler schedule 08/14/2015. She is still complaining of discomfort swelling and foot right since the initial visit of 08/05/2015. Also, patient is requesting debridement of toenails today This patient presents today complaining of swelling, burning localized to the dorsal aspect of right foot for approximately 2 weeks. The symptoms have gradually worsened over this time. She says the swelling tends to decrease with rest and elevation of the foot and increased with standing, walking or when the foot is independency. Patient has a history of gout inflammation and initially she thought the swelling would be associated with gout. Patient is a type II diabetic  Review of Systems  Cardiovascular: Positive for leg swelling.  Musculoskeletal: Positive for gait problem.    Physical Exam  Orientated 3  Vascular: No calf pain or calf edema bilaterally DP right 2/4 and 0/4 left PT right 0/4 and PT left 2/4 Capillary reflex delayed bilaterally  Neurological: Sensation to 10 g monofilament wire intact 5/5 bilaterally Vibratory sensation reactive bilaterally Ankle reflex equal reactive bilaterally  Dermatological: The toenails are elongated, brittle, deformed, elongated and tender to direct palpation 6-10 No open skin lesions bilaterally Localized edema dorsal lateral aspect of the right foot No erythema, warmth noted in the localized area of edema on the dorsal aspect of the right foot  Musculoskeletal: HAV left No restriction ankle, subtalar, midtarsal joints bilaterally   X-ray examination weightbearing right foot dated 08/05/2015  Intact bony structures without fracture and/or dislocation Decreased bone density in all views Cystic changes metatarsals and midfoot Significant decrease of  joint space at Lisfranc's articulation  Radiographic impression: No acute bony abnormality noted left foot Degenerative diffuse changes in metatarsals and Lisfranc's articulation   Assessment: Absent pedal pulses bilaterally Edema right foot with definitive diagnosis pending As edema seems very localized and there is no warmth gout is not an obvious diagnosis at this time, however cannot rule out a resolving gout flare Cannot rule out possible Charcot's Significant degenerative changes right foot Symptomatic onychomycoses 6-10  Plan: Patient reduce activity is much as possible Continue wearing surgical shoe on right foot Referred patient to lab for CBC with differential, sedimentation rate, uric acid Debridement of toenails 6-10 mechanically and electrically without a bleeding Return 7 days to review the results of arterial Doppler Repeat foot x-ray right foot at next visit

## 2015-08-14 ENCOUNTER — Ambulatory Visit (HOSPITAL_COMMUNITY)
Admission: RE | Admit: 2015-08-14 | Discharge: 2015-08-14 | Disposition: A | Discharge status: Home / Self Care | Payer: Medicare Other | Source: Ambulatory Visit | Attending: Podiatry | Admitting: Podiatry

## 2015-08-14 DIAGNOSIS — E119 Type 2 diabetes mellitus without complications: Secondary | ICD-10-CM | POA: Insufficient documentation

## 2015-08-14 DIAGNOSIS — I10 Essential (primary) hypertension: Secondary | ICD-10-CM | POA: Insufficient documentation

## 2015-08-14 DIAGNOSIS — R0989 Other specified symptoms and signs involving the circulatory and respiratory systems: Secondary | ICD-10-CM | POA: Insufficient documentation

## 2015-08-14 DIAGNOSIS — E1351 Other specified diabetes mellitus with diabetic peripheral angiopathy without gangrene: Secondary | ICD-10-CM

## 2015-08-14 DIAGNOSIS — M79671 Pain in right foot: Secondary | ICD-10-CM

## 2015-08-14 DIAGNOSIS — E785 Hyperlipidemia, unspecified: Secondary | ICD-10-CM | POA: Diagnosis not present

## 2015-08-14 DIAGNOSIS — M25474 Effusion, right foot: Secondary | ICD-10-CM

## 2015-08-19 ENCOUNTER — Ambulatory Visit (INDEPENDENT_AMBULATORY_CARE_PROVIDER_SITE_OTHER): Payer: Medicare Other | Admitting: Podiatry

## 2015-08-19 ENCOUNTER — Encounter: Payer: Self-pay | Admitting: Podiatry

## 2015-08-19 ENCOUNTER — Ambulatory Visit (INDEPENDENT_AMBULATORY_CARE_PROVIDER_SITE_OTHER): Payer: Medicare Other

## 2015-08-19 VITALS — BP 142/78 | HR 79 | Resp 12

## 2015-08-19 DIAGNOSIS — M19071 Primary osteoarthritis, right ankle and foot: Secondary | ICD-10-CM

## 2015-08-19 DIAGNOSIS — M79671 Pain in right foot: Secondary | ICD-10-CM

## 2015-08-19 NOTE — Patient Instructions (Signed)
Today your foot x-ray  Was unchanged from your baseline x-ray Your vascular examination was demonstrated adequate circulation into your feet Your uric acid levels are consistently high, please contact your primary care physician and show me her recent lab Also, it is possible that year right foot pain may be associated with her diabetic foot problem and I am dispensing a boot for you to wear in the right foot when standing and walking instead of the surgical shoe  Return 4 weeks for evaluationor sooner if you have concern

## 2015-08-19 NOTE — Progress Notes (Signed)
Subjective:    Patient ID: [name], female    DOB: 1940-11-13, 75 y.o.   MRN: [id number]  HPI  Patient presents today for follow-up visit of 08/11/2015. Patient has pending [name] extremity arterial Doppler schedule 08/14/2015. She is still complaining of discomfort swelling and foot right since the initial visit of 08/05/2015. She continues to complain of pain and swelling in the right foot which has reduced in the symptoms since the initial visit. Patient has had an arterial Doppler and arthritic screening panel since the visit of 6000 [id number] This patient presents today complaining of swelling, burning localized to the dorsal aspect of right foot for approximately 2 weeks. The symptoms have gradually worsened over this time. She says the swelling tends to decrease with rest and elevation of the foot and increased with standing, walking or when the foot is independency. Patient has a history of gout inflammation and initially she thought the swelling would be associated with gout. Patient is a type II diabetic  Review of Systems  Cardiovascular: Positive for leg swelling.  Musculoskeletal: Positive for gait problem.       Objective:   Physical Exam  Orientated 3  Vascular: No calf pain or calf edema bilaterally DP right 2/4 and 0/4 left PT right 0/4 and PT left 2/4 Capillary reflex delayed bilaterally  Neurological: Sensation to 10 g monofilament wire intact 5/5 bilaterally Vibratory sensation reactive bilaterally Ankle reflex equal reactive bilaterally  Dermatological: The toenails are elongated, brittle, deformed, elongated and tender to direct palpation 6-10 No open skin lesions bilaterally Localized edema dorsal lateral aspect of the right foot No erythema, warmth noted in the localized area of edema on the dorsal aspect of the right foot  Musculoskeletal: HAV left No restriction ankle, subtalar, midtarsal joints bilaterally There is general tenderness to  palpation dorsal right foot without any palpable lesions Patient has a painful gait when she transfers to the treatment chair  X-ray examination right foot dated 08/19/2015  Intact bony structures without fracture and/or dislocation Decreased bone density in all views Cystic changes noted in metatarsals and midfoot Decrease joint spaces at Lisfranc's articulation X-ray has similar appearance to baseline x-ray of 08/05/2015  Radiographic impression: Degenerative changes right foot No change from baseline x-ray of 08/05/2015 Correlate x-ray with clinical findings  Clinical lab dated 08/19/2015 CBC Decrease M CH and MCHC No increased WBC  Sedimentation rate 34, slightly elevated  Uric acid 8.6 elevated Uric acid 01/23/2015 8.5 Uric acid 04/29/2013 10.0  [name] extremity arterial Doppler dated 08/14/2015 Impressions Cardiac arrhythmia makes waveform analysis challenging. No obvious [name] extremity arterial occlusive disease, although there is some evidence of medial calcification. Bilateral ABI's and toe-brachial indices are normal. Bilateral common femoral, popliteal, anterior and posterior tibial artery CW waveforms are brisk and multiphasic. Patient has a rapid heart rate, making definitive waveform analysis difficult. All toe PPG waveforms are essentially normal.       Assessment & Plan:   Assessment: Degenerative changes right foot could be consistent with osteoarthritis Possible gout flare resulting in pain Cannot rule out Charcot's osteoarthropathy right foot  Plan: Today I reviewed the results of the exam, x-ray, arterial Doppler and lab with the patient in detail. I made her aware that her definitive diagnosis still was pending and I was going to continue protective weightbearing in the event that she was developing Charcot's osteoarthropathy. A Cam Walker boot was dispensed to wear on the right foot Patient was advised to contact her primary care physician  in  regards to her elevated uric acid and current lab work and recent chartprovided to patient. Patient will limit standing and walking and weight-bear with Cam Walker boot   Reevaluate 4 weeks or sooner if patient has a concern and re-x-ray right foot at that time

## 2015-09-16 ENCOUNTER — Encounter (INDEPENDENT_AMBULATORY_CARE_PROVIDER_SITE_OTHER): Payer: Medicare Other | Admitting: Podiatry

## 2015-09-16 NOTE — Progress Notes (Signed)
This encounter was created in error - please disregard.

## 2016-06-01 IMAGING — CR DG CHEST 2V
2 series · 2 of 2 positions shown · non-contrast
Comparison: CT 02/08/2015.  Radiograph 01/23/2015.

CLINICAL DATA: Center CP since last night. SOB last night but not
today. Hx of HTN, DM, TIA. Nonsmoker.

EXAM:
CHEST  2 VIEW

[chest pa]
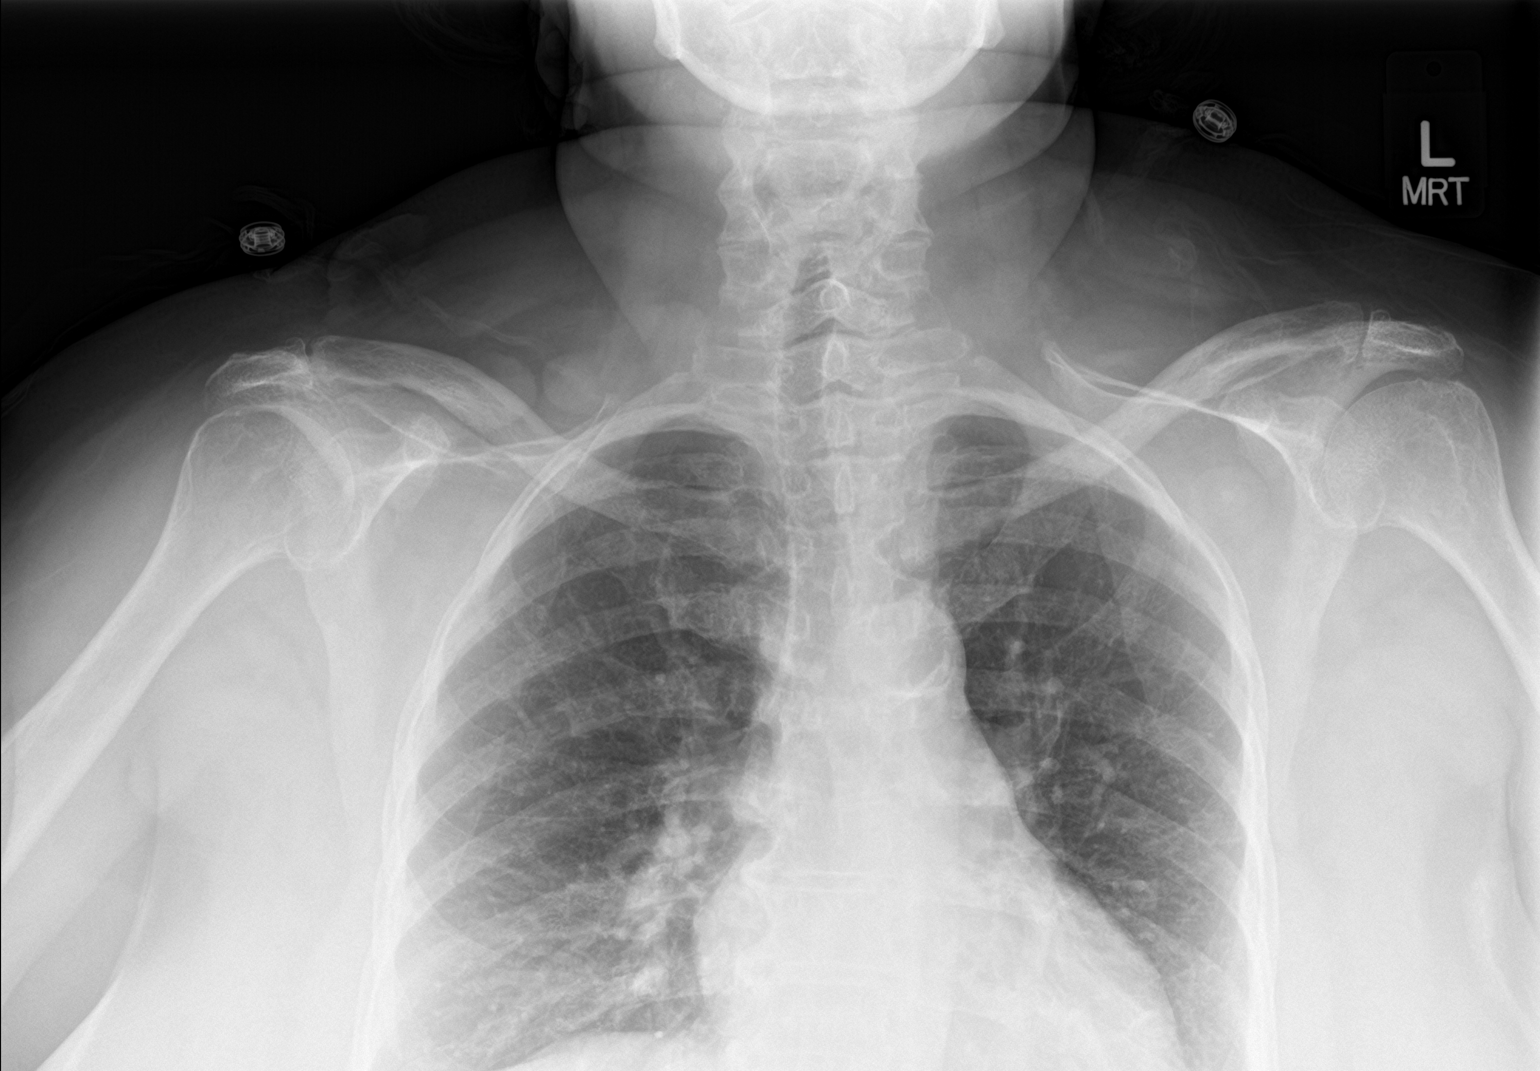

[chest lat]
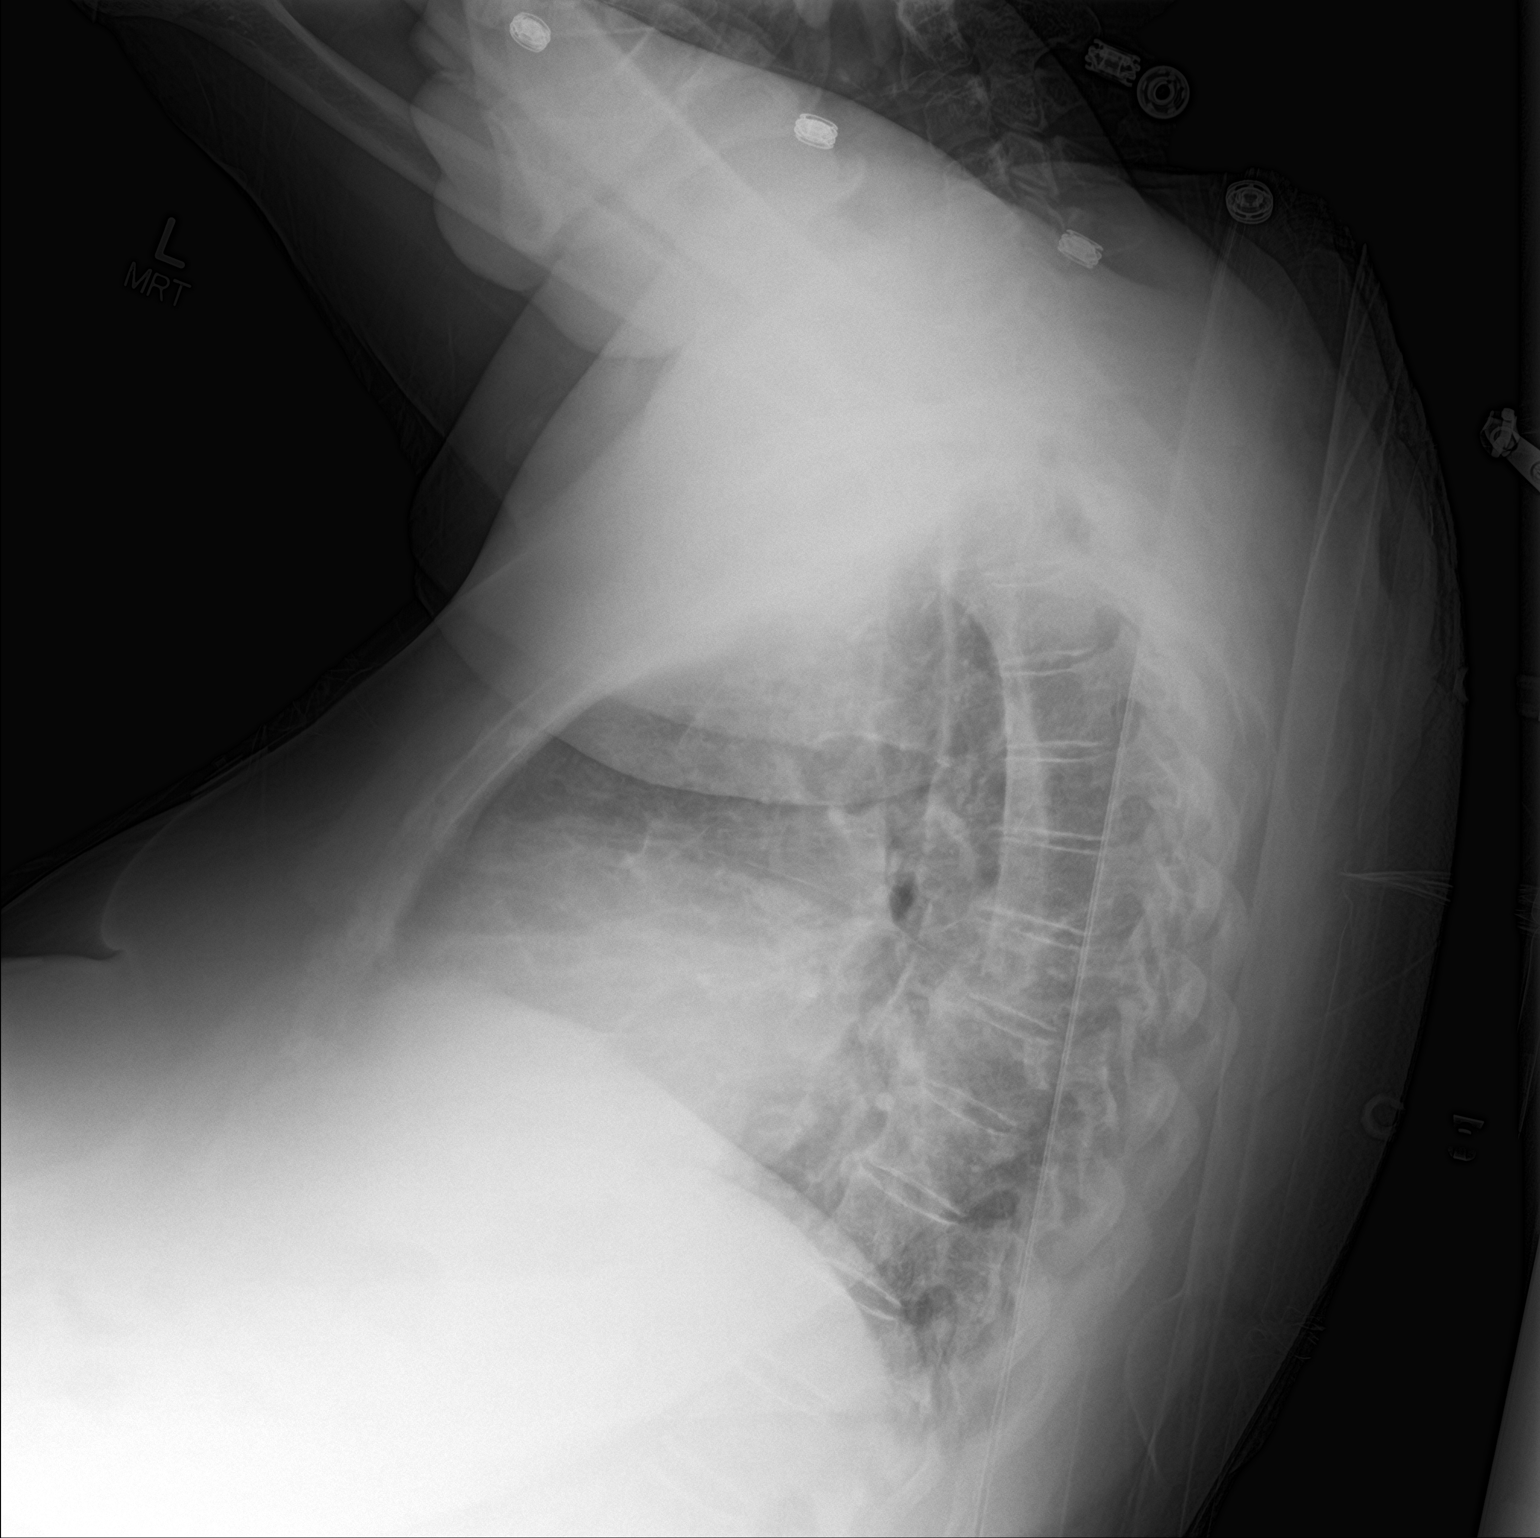

[2 of 2 positions shown; findings below may reference images not displayed]

FINDINGS: Lateral view degraded by patient arm position. Midline trachea.
Normal heart size. Atherosclerosis in the transverse aorta. lower
chest is excluded from the frontal radiograph. Given this factor, no
pleural fluid or pneumothorax identified. Low lung volumes. No lobar
consolidation.
IMPRESSION: No acute cardiopulmonary disease.

Aortic atherosclerosis.

Exclusion of the lung bases from the frontal radiograph.

## 2023-09-29 DEATH — deceased
